# Patient Record
Sex: Female | Born: 1970
Health system: Southern US, Community
[De-identification: ages and names within clinical notes are randomized; demographics above are authoritative.]

## PROBLEM LIST (undated history)

## (undated) DIAGNOSIS — K5010 Crohn's disease of large intestine without complications: Secondary | ICD-10-CM

## (undated) DIAGNOSIS — T7840XA Allergy, unspecified, initial encounter: Secondary | ICD-10-CM

## (undated) DIAGNOSIS — F988 Other specified behavioral and emotional disorders with onset usually occurring in childhood and adolescence: Secondary | ICD-10-CM

## (undated) DIAGNOSIS — Z862 Personal history of diseases of the blood and blood-forming organs and certain disorders involving the immune mechanism: Secondary | ICD-10-CM

## (undated) DIAGNOSIS — F418 Other specified anxiety disorders: Secondary | ICD-10-CM

## (undated) DIAGNOSIS — K5090 Crohn's disease, unspecified, without complications: Secondary | ICD-10-CM

## (undated) DIAGNOSIS — K219 Gastro-esophageal reflux disease without esophagitis: Secondary | ICD-10-CM

## (undated) DIAGNOSIS — D649 Anemia, unspecified: Secondary | ICD-10-CM

## (undated) DIAGNOSIS — N200 Calculus of kidney: Secondary | ICD-10-CM

## (undated) HISTORY — DX: Personal history of diseases of the blood and blood-forming organs and certain disorders involving the immune mechanism: Z86.2

## (undated) HISTORY — PX: CHOLECYSTECTOMY: SHX55

## (undated) HISTORY — DX: Calculus of kidney: N20.0

## (undated) HISTORY — DX: Crohn's disease, unspecified, without complications: K50.90

## (undated) HISTORY — PX: TONSILLECTOMY: SUR1361

## (undated) HISTORY — DX: Allergy, unspecified, initial encounter: T78.40XA

## (undated) HISTORY — DX: Other specified anxiety disorders: F41.8

## (undated) HISTORY — DX: Crohn's disease of large intestine without complications: K50.10

## (undated) HISTORY — DX: Gastro-esophageal reflux disease without esophagitis: K21.9

## (undated) HISTORY — DX: Other specified behavioral and emotional disorders with onset usually occurring in childhood and adolescence: F98.8

---
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
## 1998-01-23 ENCOUNTER — Other Ambulatory Visit: Admission: RE | Admit: 1998-01-23 | Discharge: 1998-01-23 | Payer: Self-pay | Admitting: Obstetrics & Gynecology

## 1998-05-22 ENCOUNTER — Ambulatory Visit (HOSPITAL_COMMUNITY): Admission: RE | Admit: 1998-05-22 | Discharge: 1998-05-22 | Payer: Self-pay | Admitting: Obstetrics and Gynecology

## 1998-08-10 ENCOUNTER — Inpatient Hospital Stay (HOSPITAL_COMMUNITY): Admission: AD | Admit: 1998-08-10 | Discharge: 1998-08-14 | Payer: Self-pay | Admitting: Obstetrics & Gynecology

## 1999-07-02 ENCOUNTER — Other Ambulatory Visit: Admission: RE | Admit: 1999-07-02 | Discharge: 1999-07-02 | Payer: Self-pay | Admitting: Obstetrics & Gynecology

## 2000-09-22 ENCOUNTER — Inpatient Hospital Stay (HOSPITAL_COMMUNITY): Admission: AD | Admit: 2000-09-22 | Discharge: 2000-09-24 | Payer: Self-pay | Admitting: Obstetrics & Gynecology

## 2000-10-21 ENCOUNTER — Other Ambulatory Visit: Admission: RE | Admit: 2000-10-21 | Discharge: 2000-10-21 | Payer: Self-pay | Admitting: Obstetrics & Gynecology

## 2001-11-11 ENCOUNTER — Other Ambulatory Visit: Admission: RE | Admit: 2001-11-11 | Discharge: 2001-11-11 | Payer: Self-pay | Admitting: Obstetrics & Gynecology

## 2002-11-16 ENCOUNTER — Other Ambulatory Visit: Admission: RE | Admit: 2002-11-16 | Discharge: 2002-11-16 | Payer: Self-pay | Admitting: Obstetrics & Gynecology

## 2003-11-21 ENCOUNTER — Other Ambulatory Visit: Admission: RE | Admit: 2003-11-21 | Discharge: 2003-11-21 | Payer: Self-pay | Admitting: Obstetrics and Gynecology

## 2003-12-21 ENCOUNTER — Other Ambulatory Visit: Admission: RE | Admit: 2003-12-21 | Discharge: 2003-12-21 | Payer: Self-pay | Admitting: Obstetrics and Gynecology

## 2004-07-17 ENCOUNTER — Ambulatory Visit: Payer: Self-pay | Admitting: Gastroenterology

## 2004-08-29 ENCOUNTER — Ambulatory Visit: Payer: Self-pay | Admitting: Pulmonary Disease

## 2004-09-11 ENCOUNTER — Ambulatory Visit: Payer: Self-pay | Admitting: Gastroenterology

## 2004-09-12 ENCOUNTER — Encounter: Admission: RE | Admit: 2004-09-12 | Discharge: 2004-09-12 | Payer: Self-pay | Admitting: Gastroenterology

## 2004-11-29 ENCOUNTER — Other Ambulatory Visit: Admission: RE | Admit: 2004-11-29 | Discharge: 2004-11-29 | Payer: Self-pay | Admitting: Obstetrics and Gynecology

## 2005-05-22 ENCOUNTER — Inpatient Hospital Stay (HOSPITAL_COMMUNITY): Admission: RE | Admit: 2005-05-22 | Discharge: 2005-05-25 | Payer: Self-pay | Admitting: Obstetrics and Gynecology

## 2005-05-22 ENCOUNTER — Encounter (INDEPENDENT_AMBULATORY_CARE_PROVIDER_SITE_OTHER): Payer: Self-pay | Admitting: Specialist

## 2005-07-11 ENCOUNTER — Ambulatory Visit: Payer: Self-pay | Admitting: Gastroenterology

## 2005-07-11 ENCOUNTER — Ambulatory Visit (HOSPITAL_COMMUNITY): Admission: RE | Admit: 2005-07-11 | Discharge: 2005-07-11 | Payer: Self-pay | Admitting: Gastroenterology

## 2005-07-21 ENCOUNTER — Ambulatory Visit (HOSPITAL_COMMUNITY): Admission: RE | Admit: 2005-07-21 | Discharge: 2005-07-21 | Payer: Self-pay | Admitting: *Deleted

## 2005-07-21 ENCOUNTER — Encounter (INDEPENDENT_AMBULATORY_CARE_PROVIDER_SITE_OTHER): Payer: Self-pay | Admitting: *Deleted

## 2005-10-06 ENCOUNTER — Ambulatory Visit: Payer: Self-pay | Admitting: Pulmonary Disease

## 2006-01-14 ENCOUNTER — Ambulatory Visit: Payer: Self-pay | Admitting: Pulmonary Disease

## 2006-10-14 ENCOUNTER — Ambulatory Visit: Payer: Self-pay | Admitting: Gastroenterology

## 2006-10-14 LAB — CONVERTED CEMR LAB
Basophils Absolute: 0 10*3/uL (ref 0.0–0.1)
Basophils Relative: 0.7 % (ref 0.0–1.0)
Eosinophils Absolute: 0.2 10*3/uL (ref 0.0–0.6)
Eosinophils Relative: 2.8 % (ref 0.0–5.0)
Folate: 15.9 ng/mL
HCT: 39.2 % (ref 36.0–46.0)
Hemoglobin: 13.6 g/dL (ref 12.0–15.0)
Lymphocytes Relative: 30.4 % (ref 12.0–46.0)
MCHC: 34.6 g/dL (ref 30.0–36.0)
MCV: 94.8 fL (ref 78.0–100.0)
Monocytes Absolute: 0.3 10*3/uL (ref 0.2–0.7)
Monocytes Relative: 5.6 % (ref 3.0–11.0)
Neutro Abs: 3.7 10*3/uL (ref 1.4–7.7)
Neutrophils Relative %: 60.5 % (ref 43.0–77.0)
Platelets: 327 10*3/uL (ref 150–400)
RBC: 4.13 M/uL (ref 3.87–5.11)
RDW: 11.8 % (ref 11.5–14.6)
Sed Rate: 13 mm/hr (ref 0–25)
Vitamin B-12: 521 pg/mL (ref 211–911)
WBC: 6 10*3/uL (ref 4.5–10.5)

## 2006-12-01 ENCOUNTER — Encounter (INDEPENDENT_AMBULATORY_CARE_PROVIDER_SITE_OTHER): Payer: Self-pay | Admitting: *Deleted

## 2006-12-01 ENCOUNTER — Ambulatory Visit: Payer: Self-pay | Admitting: Gastroenterology

## 2006-12-01 ENCOUNTER — Encounter (INDEPENDENT_AMBULATORY_CARE_PROVIDER_SITE_OTHER): Payer: Self-pay | Admitting: Gastroenterology

## 2006-12-30 ENCOUNTER — Ambulatory Visit: Payer: Self-pay | Admitting: Gastroenterology

## 2008-01-21 DIAGNOSIS — K219 Gastro-esophageal reflux disease without esophagitis: Secondary | ICD-10-CM

## 2008-01-21 DIAGNOSIS — K449 Diaphragmatic hernia without obstruction or gangrene: Secondary | ICD-10-CM

## 2008-11-10 ENCOUNTER — Encounter (INDEPENDENT_AMBULATORY_CARE_PROVIDER_SITE_OTHER): Payer: Self-pay | Admitting: *Deleted

## 2009-09-08 HISTORY — PX: ABDOMINAL HYSTERECTOMY: SHX81

## 2009-10-24 ENCOUNTER — Encounter: Payer: Self-pay | Admitting: Internal Medicine

## 2009-10-24 LAB — CONVERTED CEMR LAB
TSH: 3.021 microintl units/mL
Vit D, 25-Hydroxy: 35 ng/mL
Vitamin B-12: 399 pg/mL

## 2009-12-17 ENCOUNTER — Ambulatory Visit: Payer: Self-pay | Admitting: Internal Medicine

## 2009-12-17 ENCOUNTER — Encounter (INDEPENDENT_AMBULATORY_CARE_PROVIDER_SITE_OTHER): Payer: Self-pay | Admitting: *Deleted

## 2009-12-17 DIAGNOSIS — R1319 Other dysphagia: Secondary | ICD-10-CM | POA: Insufficient documentation

## 2009-12-17 DIAGNOSIS — F341 Dysthymic disorder: Secondary | ICD-10-CM

## 2009-12-24 ENCOUNTER — Ambulatory Visit: Payer: Self-pay | Admitting: Internal Medicine

## 2009-12-24 HISTORY — PX: UPPER GASTROINTESTINAL ENDOSCOPY: SHX188

## 2009-12-24 HISTORY — PX: COLONOSCOPY W/ BIOPSIES AND POLYPECTOMY: SHX1376

## 2009-12-24 LAB — CONVERTED CEMR LAB
ALT: 13 units/L (ref 0–35)
AST: 17 units/L (ref 0–37)
Albumin: 3.7 g/dL (ref 3.5–5.2)
BUN: 9 mg/dL (ref 6–23)
Basophils Absolute: 0 10*3/uL (ref 0.0–0.1)
Basophils Relative: 0.7 % (ref 0.0–3.0)
Calcium: 9.1 mg/dL (ref 8.4–10.5)
Chloride: 108 meq/L (ref 96–112)
Eosinophils Absolute: 0.3 10*3/uL (ref 0.0–0.7)
Eosinophils Relative: 4.6 % (ref 0.0–5.0)
GFR calc non Af Amer: 73.29 mL/min (ref >60)
Glucose, Bld: 86 mg/dL (ref 70–99)
Hemoglobin: 13 g/dL (ref 12.0–15.0)
Lymphocytes Relative: 28.1 % (ref 12.0–46.0)
Lymphs Abs: 2 10*3/uL (ref 0.7–4.0)
MCHC: 34.5 g/dL (ref 30.0–36.0)
MCV: 94 fL (ref 78.0–100.0)
Monocytes Absolute: 0.4 10*3/uL (ref 0.1–1.0)
Monocytes Relative: 5.1 % (ref 3.0–12.0)
Neutro Abs: 4.3 10*3/uL (ref 1.4–7.7)
Neutrophils Relative %: 61.5 % (ref 43.0–77.0)
Platelets: 233 10*3/uL (ref 150.0–400.0)
Potassium: 3.9 meq/L (ref 3.5–5.1)
RBC: 4.01 M/uL (ref 3.87–5.11)
RDW: 13.8 % (ref 11.5–14.6)
Sodium: 143 meq/L (ref 135–145)
Total Protein: 7.2 g/dL (ref 6.0–8.3)
WBC: 7 10*3/uL (ref 4.5–10.5)

## 2009-12-28 DIAGNOSIS — K50119 Crohn's disease of large intestine with unspecified complications: Secondary | ICD-10-CM | POA: Insufficient documentation

## 2010-01-30 ENCOUNTER — Telehealth: Payer: Self-pay | Admitting: Pulmonary Disease

## 2010-02-19 ENCOUNTER — Encounter (INDEPENDENT_AMBULATORY_CARE_PROVIDER_SITE_OTHER): Payer: Self-pay | Admitting: *Deleted

## 2010-07-08 ENCOUNTER — Telehealth (INDEPENDENT_AMBULATORY_CARE_PROVIDER_SITE_OTHER): Payer: Self-pay | Admitting: *Deleted

## 2010-08-21 ENCOUNTER — Ambulatory Visit (HOSPITAL_COMMUNITY)
Admission: RE | Admit: 2010-08-21 | Discharge: 2010-08-22 | Payer: Self-pay | Source: Home / Self Care | Attending: Obstetrics and Gynecology | Admitting: Obstetrics and Gynecology

## 2010-08-21 ENCOUNTER — Encounter (INDEPENDENT_AMBULATORY_CARE_PROVIDER_SITE_OTHER): Payer: Self-pay | Admitting: Obstetrics and Gynecology

## 2010-10-10 NOTE — Letter (Signed)
Summary: Office Visit Letter  [facility]  [address] [place], [place] [address]   Phone: [phone]  Fax: [phone]      February 19, 2010 MRN: [id number]   Sydney [name] [address] [place], [place]  [address]   Dear [name],   According to our records, it is time for you to schedule a follow-up office visit with [place].   At your convenience, please call [phone] (option #2)to schedule an office visit. If you have any questions, concerns, or feel that this letter is in error, we would appreciate your call.   Sincerely,    Iva Boop, M.D.  [facility] [phone]

## 2010-10-10 NOTE — Procedures (Signed)
Summary: Colonoscopy  Patient: Sydney [name] Note: All result statuses are Final unless otherwise noted.  Tests: (1) Colonoscopy (COL)   COL Colonoscopy           DONE     [facility]     [address]     [place], [place]  [address]           COLONOSCOPY PROCEDURE REPORT           PATIENT:  Sydney [name], Sydney [name]  MR#:  [id number]     BIRTHDATE:  [birth year], 39 yrs. old  GENDER:  female     ENDOSCOPIST:  Iva Boop, MD, [facility]           PROCEDURE DATE:  12/24/2009     PROCEDURE:  Colonoscopy with biopsy     ASA CLASS:  Class I     INDICATIONS:  surveillance and high-risk screening, Crohn's     disease - Crohn's colitis for 20+ years     MEDICATIONS:   There was residual sedation effect present from     prior procedure., Fentanyl 25 mcg IV, Versed 3 mg IV           DESCRIPTION OF PROCEDURE:   After the risks benefits and     alternatives of the procedure were thoroughly explained, informed     consent was obtained.  Digital rectal exam was performed and     revealed no abnormalities.   The LB PCF-Q180AL [id number] endoscope     was introduced through the anus and advanced to the terminal ileum     which was intubated for a short distance, without limitations.     The quality of the prep was excellent, using MoviPrep.  The     instrument was then slowly withdrawn as the colon was fully     examined. Insertion: 2:34 minutes Withdrawal: 15:11 minutes     <<PROCEDUREIMAGES>>           FINDINGS:  The terminal ileum appeared normal.  Pseudopolyps     (suspected) seen  throughout the colon. Polypoid lesions     consistent with these (she has had), numerous, throughout the     colon. They were biopsied in a semi-targeted fashion along with     other mucosa in a segmental fashion. Largest size about 1 cm.     Multiple biopsies were obtained and sent to pathology.  Abnormal     appearing mucosa throughout the colon. Patchy hyperpigmentation     and somewhat scalloped mucosa (suspect  scarring). Multiple     biopsies were obtained as part of surveillance biopsy protocol and     sent to pathology.   Retroflexed views in the rectum revealed no     abnormalities.    The scope was then withdrawn from the patient     and the procedure completed.           COMPLICATIONS:  None           ENDOSCOPIC IMPRESSION:     1) Normal terminal ileum     2) Pseudopolyps (suspected) throughout the colon - biopsied     3) Abnormal mucosa throughout the colon - patchy     hyperpigmentation and scalloped mucosa without obvious     inflammation - biopsied     4) Crohn's disease - known though not much disease activity seen     today except for pseudopolyps  RECOMMENDATIONS:     1) Await biopsy results           REPEAT EXAM:  In for Colonoscopy, pending biopsy results.           Iva Boop, MD, [facility]           CC:  The Patient           n.     eSIGNED:   Iva Boop at 12/24/2009 04:07 PM           Zerita Boers, [id number]  Note: An exclamation mark (!) indicates a result that was not dispersed into the flowsheet. Document Creation Date: 12/24/2009 4:08 PM _______________________________________________________________________  (1) Order result status: Final Collection or observation date-time: 12/24/2009 15:53 Requested date-time:  Receipt date-time:  Reported date-time:  Referring Physician:   Ordering Physician: Stan Head [phone]) Specimen Source:  Source: Launa Grill Order Number: [id number] Lab site:   Appended Document: Colonoscopy     Procedures Next Due Date:    Colonoscopy: 01/2012

## 2010-10-10 NOTE — Letter (Signed)
Summary: [facility] Instructions  [facility]  [address] [place], [place] [address]   Phone: [phone]  Fax: [phone]       Verla Tailor    40 years old    MRN: [id number]      Procedure Day Dorna Bloom: Duanne Limerick, 12/24/09     Arrival Time: 2:00 PM      Procedure Time: 3:00 PM    Location of Procedure:                    _X_   Endoscopy Center (4th Floor)                       PREPARATION FOR COLONOSCOPY WITH MOVIPREP   Starting 5 days prior to your procedure 12/19/09 do not eat nuts, seeds, popcorn, corn, beans, peas,  salads, or any raw vegetables.  Do not take any fiber supplements (e.g. Metamucil, Citrucel, and Benefiber).  THE DAY BEFORE YOUR PROCEDURE         SUNDAY, 12/23/09  1.  Drink clear liquids the entire day-NO SOLID FOOD  2.  Do not drink anything colored red or purple.  Avoid juices with pulp.  No orange juice.  3.  Drink at least 64 oz. (8 glasses) of fluid/clear liquids during the day to prevent dehydration and help the prep work efficiently.  CLEAR LIQUIDS INCLUDE: Water Jello Ice Popsicles Tea (sugar ok, no milk/cream) Powdered fruit flavored drinks Coffee (sugar ok, no milk/cream) Gatorade Juice: apple, white grape, white cranberry  Lemonade Clear bullion, consomm, broth Carbonated beverages (any kind) Strained chicken noodle soup Hard Candy                             4.  In the morning, mix first dose of MoviPrep solution:    Empty 1 Pouch A and 1 Pouch B into the disposable container    Add lukewarm drinking water to the top line of the container. Mix to dissolve    Refrigerate (mixed solution should be used within 24 hrs)  5.  Begin drinking the prep at 5:00 p.m. The MoviPrep container is divided by 4 marks.   Every 15 minutes drink the solution down to the next mark (approximately 8 oz) until the full liter is complete.   6.  Follow completed prep with 16 oz of clear liquid of your choice (Nothing red or purple).   Continue to drink clear liquids until bedtime.   7.  Before going to bed, mix second dose of MoviPrep solution:    Empty 1 Pouch A and 1 Pouch B into the disposable container    Add lukewarm drinking water to the top line of the container. Mix to dissolve    Refrigerate  THE DAY OF YOUR PROCEDURE      MONDAY, 12/24/09  Beginning at 10:00 a.m. (5 hours before procedure):         1. Every 15 minutes, drink the solution down to the next mark (approx 8 oz) until the full liter is complete.  2. Follow completed prep with 16 oz. of clear liquid of your choice.    3. You may drink clear liquids until 1:00 PM (2 HOURS BEFORE PROCEDURE).  MEDICATION INSTRUCTIONS  Unless otherwise instructed, you should take regular prescription medications with a small sip of water   as early as possible the morning of your procedure.       OTHER  INSTRUCTIONS  You will need a responsible adult at least 42 years of age to accompany you and drive you home.   This person must remain in the waiting room during your procedure.  Wear loose fitting clothing that is easily removed.  Leave jewelry and other valuables at home.  However, you may wish to bring a book to read or  an iPod/MP3 player to listen to music as you wait for your procedure to start.  Remove all body piercing jewelry and leave at home.  Total time from sign-in until discharge is approximately 2-3 hours.  You should go home directly after your procedure and rest.  You can resume normal activities the  day after your procedure.  The day of your procedure you should not:   Drive   Make legal decisions   Operate machinery   Drink alcohol   Return to work  You will receive specific instructions about eating, activities and medications before you leave.  The above instructions have been reviewed and explained to me by   [facility], CMA   I fully understand and can verbalize these instructions _____________________________ Date  _________

## 2010-10-10 NOTE — Assessment & Plan Note (Signed)
Summary: Crohn's, dysphagia    History of Present Illness Visit Type: Follow-up Visit Primary GI MD: Stan Head MD [facility] Primary Provider: Alroy Dust, MD  Requesting Provider: n/a Chief Complaint: Heartburn  History of Present Illness:   1) Crohn's colitis - recall letter 11/2008 - last colonoscopy 3/08 She has been off therapy x 4 years - got tired of taking so many pills  "Dr. Doreatha Martin spoiled me letting me do what I wanted to"  She is having problems in the mornings and then I get settled down. Diarrhea in the morning, "pretty bad" or "after I eat a meal, especially out". Not nocturnal except rare. Abdominal pain occurs in bilateral lower quadrants and flanks. No rectal bleeding.  Not much appetitie, changed from Wellbutrin and Zoloft for depression and anxiety, now on Prozac x 1 monh.  2) Dysphagia and heartburn She is most concerned about dysphagia and terrible indigestion - last 1-2 weeks especially. Better now. It has been there for 1 month.  She was eating "right many Tums". She was also on Vyvanse but did ot tolerate due to insomnia. "Tea and coffee frequently"   GI Review of Systems    Reports heartburn.      Denies abdominal pain, acid reflux, belching, bloating, chest pain, dysphagia with liquids, dysphagia with solids, loss of appetite, nausea, vomiting, vomiting blood, weight loss, and  weight gain.        Denies anal fissure, black tarry stools, change in bowel habit, constipation, diarrhea, diverticulosis, fecal incontinence, heme positive stool, hemorrhoids, irritable bowel syndrome, jaundice, light color stool, liver problems, rectal bleeding, and  rectal pain.    Current Medications (verified): 1)  Tums 500 Mg Chew (Calcium Carbonate Antacid) .... As Needed 2)  Prozac 40 Mg Caps (Fluoxetine Hcl) .... One Tablet By Mouth Two Times A Day  Allergies (verified): 1)  Pcn 2)  Flagyl  Past History:  Past Medical History: GERD (ICD-530.81) CROHN'S DISEASE  (ICD-555.9) ADD Depression/anxiety  Past Surgical History: Cholecystectomy C-Section x 3  Family History: No FH of Colon Cancer:  Social History: Occupation: Unemployed  Married , 3 kids Patient is a former smoker: Research scientist (life sciences) Alcohol Use - no Daily Caffeine Use: Tea and Coffee all day Illicit Drug Use - no Smoking Status:  quit Drug Use:  no  Review of Systems       anxiety and depression under changing therapy (Dr. Evelene Croon) knee, wrist and ankle pains All other ROS negative except as per HPI.   Vital Signs:  Patient profile:   40 year old female Height:      63 inches Weight:      140 pounds BMI:     24.89 BSA:     1.66 Pulse rate:   76 / minute Pulse rhythm:   regular BP sitting:   112 / 68  (left arm) Cuff size:   regular  Vitals Entered By: Ok Anis CMA (December 17, 2009 8:56 AM)  Physical Exam  General:  Well developed, well nourished, no acute distress. Head:  Normocephalic and atraumatic. Eyes:  PERRLA, no icterus. Mouth:  No deformity or lesions, dentition normal. Neck:  Supple; no masses or thyromegaly. Lungs:  Clear throughout to auscultation. Heart:  Regular rate and rhythm; no murmurs, rubs,  or bruits. Abdomen:  Soft, nontender and nondistended. No masses, hepatosplenomegaly or hernias noted. Normal bowel sounds. Rectal:  deferred until time of colonoscopy.   Extremities:  No clubbing, cyanosis, edema or deformities noted. Neurologic:  Alert and  oriented  x4;  Skin:  tattoos lower extremities Cervical Nodes:  No significant cervical or supraclavicular adenopathy.   Psych:  mildly anxious   Impression & Recommendations:  Problem # 1:  CROHN'S DISEASE, LARGE INTESTINE (ICD-555.1) Assessment New dDiagnosed 1991, ASA and prednisone originally, 1994-96. Then ASA tx +/- prednisone. needs screening and surveillance - diagnosed in her teens, has been on prednisone and ASA therapy but non-compliant with therapy over time.  restart meds ater ROI  [place] labs - have returned and flowed, no CBC, B12 was ok 12/30/06  Problem # 2:  DYSPHAGIA (ICD-787.29) Assessment: New with heartburn, known GERD so that or possible stricture (or both) is most likely problem better than it was Dexilant once daily samples then EGD, possible dilation - reassess at time of EGD re: dilation  Orders: Colon/Endo (Colon/Endo)  Problem # 3:  SCREENING, COLON CANCER (ICD-V76.51) Assessment: Unchanged Higher0-risk due to Crohn;s colitis Orders: Colon/Endo (Colon/Endo)  Problem # 4:  ANXIETY DEPRESSION (ICD-300.4) Assessment: Unchanged  Patient Instructions: 1)  Please pick up your medications at your pharmacy. MOVIPREP 2)  We will see you at your procedure on 12/24/09. 3)  Dexilant samples given.  Take as directed in list below.  Call our office with a symptom update and we will call in a prescription for you. 4)  [facility] Patient Information Guide given to patient.  5)  Colonoscopy and Flexible Sigmoidoscopy brochure given.  6)  Upper Endoscopy with Dilatation brochure given.  7)  Copy sent to : Alroy Dust, MD 8)  The medication list was reviewed and reconciled.  All changed / newly prescribed medications were explained.  A complete medication list was provided to the patient / caregiver. Prescriptions: MOVIPREP 100 GM  SOLR (PEG-KCL-NACL-NASULF-NA ASC-C) As per prep instructions.  #1 x 0   Entered by:   Francee Piccolo CMA (AAMA)   Authorized by:   Iva Boop MD, [facility]   Signed by:   Francee Piccolo CMA (AAMA) on 12/17/2009   Method used:   Electronically to        [facility] [phone]* (retail)       [address]       [place], [place]  [address]       Ph: [phone] or [phone]       Fax: [phone]   RxID:   [id number]   Appended Document: Crohn's, dysphagia she needs CBC, CMET and CRP re: Crohn's dx  Appended Document: Crohn's, dysphagia Left message for patient to call  back to discuss getting lab work on Monday prior to her ECL    Appended Document: Crohn's, dysphagia Patient aware to come for labs pior to ECl on Monday  Appended Document: Crohn's, dysphagia   Past History:  Past Medical History:    GERD (ICD-530.81)    CROHN'S COLITIS  dx 12/91, signs onset same    CROHN'S DUODENITIS AND ESOPHAGITIS, ? HSV ESOPHAGITIS    Nephrolithiasis    ADD    Depression/anxiety    left ankle fx 1990      Family History: Family History of Colitis/Crohn's:mother, grandmother, ? great-grandmother Family History of Colon Cancer: great-grandmother

## 2010-10-10 NOTE — Progress Notes (Signed)
Summary: headaches/ fatigue > needs to est w/ another PCP  Phone Note Call from Patient   Caller: Patient Call For: nadel Summary of Call: pt c/o headaches and fatigue x 2 wks. request to see dr Kriste Basque. pt last saw dr Kriste Basque in 2007. call [phone] or home # above.  Initial call taken by: Tivis Ringer, CNA,  July 08, 2010 1:01 PM  Follow-up for Phone Call        pt last saw SN 01/2006.  paper chart ordered for SN to review.  will forward message to leigh. Boone Master CNA/MA  July 08, 2010 3:02 PM   Additional Follow-up for Phone Call Additional follow up Details #1::        per SN---   SN is booked solid---rec refer to primary care downstairs   or if she has been seeing primary care doc Randell Loop [facility]  July 08, 2010 4:32 PM   called spoke with patient, advised of SN's recs as stated above.  pt verbalized her understanding.  gave # for [facility] downstairs. Additional Follow-up by: Boone Master CNA/MA,  July 08, 2010 4:39 PM

## 2010-10-10 NOTE — Progress Notes (Signed)
Summary: want to ber reinstated as pt  Phone Note Call from Patient   Caller: Patient Call For: [name] Summary of Call: pt last seen in 5/07 would like to see dr Kriste Basque for poison ivey and allergy problem Initial call taken by: Rickard Patience,  Jan 30, 2010 9:06 AM  Follow-up for Phone Call        pls ask sn if he wants to see pt back and if so need appt time   Temisha Murley [facility]  Jan 30, 2010 9:42 AM   Additional Follow-up for Phone Call Additional follow up Details #1::        per SN---ok for follow up appt with SN or TP.  thanks Randell Loop CMA  Jan 30, 2010 10:54 AM   LMTCB. Carron Curie CMA  Jan 30, 2010 11:09 AM  pt schedueld to see TP on 01-31-10 at 10:30. Carron Curie CMA  Jan 30, 2010 12:20 PM

## 2010-10-10 NOTE — Procedures (Signed)
Summary: Upper Endoscopy  Patient: Sydney [name] Note: All result statuses are Final unless otherwise noted.  Tests: (1) Upper Endoscopy (EGD)   EGD Upper Endoscopy       [facility] [address]     [place], [place]  [id number]           ENDOSCOPY PROCEDURE REPORT           PATIENT:  [name], [name]  MR#:  [id number]     BIRTHDATE:  08-Dec-1970, 39 yrs. old  GENDER:  female           ENDOSCOPIST:  Gatha Mayer, MD, [facility]           PROCEDURE DATE:  12/24/2009     PROCEDURE:  EGD with biopsy     ASA CLASS:  Class I     INDICATIONS:  heartburn, dysphagia           MEDICATIONS:   Fentanyl 75 mcg IV, Benadryl 25 mg IV, Versed 7 mg     IV     TOPICAL ANESTHETIC:  Exactacain Spray           DESCRIPTION OF PROCEDURE:   After the risks benefits and     alternatives of the procedure were thoroughly explained, informed     consent was obtained.  The LB [id number] [id number] endoscope was     introduced through the mouth and advanced to the second portion of     the duodenum, without limitations.  The instrument was slowly     withdrawn as the mucosa was fully examined.     <<PROCEDUREIMAGES>>           Mild gastritis was found. Patchy erythema and friable mucosa,     especially in proximal antrum. Multiple biopsies were obtained and     sent to pathology.  Otherwise the examination was normal.     Retroflexed views revealed no abnormalities.    The scope was then     withdrawn from the patient and the procedure completed.           COMPLICATIONS:  None           ENDOSCOPIC IMPRESSION:     1) Mild gastritis - biopsied     2) Otherwise normal examination     RECOMMENDATIONS:     1) continue current medications     2) Await biopsy results     3) colonoscopy next           REPEAT EXAM:  as needed           Gatha Mayer, MD, [facility]           CC:  The Patient           n.     eSIGNED:   Gatha Mayer at 12/24/2009 03:56 PM           Dillon Bjork,  [id number]  Note: An exclamation mark (!) indicates a result that was not dispersed into the flowsheet. Document Creation Date: 12/24/2009 3:57 PM _______________________________________________________________________  (1) Order result status: Final Collection or observation date-time: 12/24/2009 15:29 Requested date-time:  Receipt date-time:  Reported date-time:  Referring Physician:   Ordering Physician: Silvano Rusk [phone]) Specimen Source:  Source: Tawanna Cooler Order Number: [id number] Lab site:

## 2010-11-18 LAB — COMPREHENSIVE METABOLIC PANEL
ALT: 14 U/L (ref 0–35)
AST: 20 U/L (ref 0–37)
Albumin: 2.8 g/dL — ABNORMAL LOW (ref 3.5–5.2)
Alkaline Phosphatase: 50 U/L (ref 39–117)
BUN: 4 mg/dL — ABNORMAL LOW (ref 6–23)
CO2: 29 mEq/L (ref 19–32)
Calcium: 8.2 mg/dL — ABNORMAL LOW (ref 8.4–10.5)
Chloride: 98 mEq/L (ref 96–112)
Creatinine, Ser: 0.82 mg/dL (ref 0.4–1.2)
GFR calc Af Amer: >60 mL/min (ref >60)
GFR calc non Af Amer: >60 mL/min (ref >60)
Glucose, Bld: 122 mg/dL — ABNORMAL HIGH (ref 70–99)
Potassium: 3.7 mEq/L (ref 3.5–5.1)
Sodium: 132 mEq/L — ABNORMAL LOW (ref 135–145)
Total Bilirubin: 1.5 mg/dL — ABNORMAL HIGH (ref 0.3–1.2)
Total Protein: 5 g/dL — ABNORMAL LOW (ref 6.0–8.3)

## 2010-11-18 LAB — CBC
HCT: 31.6 % — ABNORMAL LOW (ref 36.0–46.0)
HCT: 37.6 % (ref 36.0–46.0)
Hemoglobin: 10.2 g/dL — ABNORMAL LOW (ref 12.0–15.0)
Hemoglobin: 12.8 g/dL (ref 12.0–15.0)
MCH: 31.3 pg (ref 26.0–34.0)
MCH: 33.1 pg (ref 26.0–34.0)
MCHC: 32.3 g/dL (ref 30.0–36.0)
MCHC: 34 g/dL (ref 30.0–36.0)
MCV: 96.9 fL (ref 78.0–100.0)
MCV: 97.4 fL (ref 78.0–100.0)
Platelets: 174 10*3/uL (ref 150–400)
Platelets: 272 10*3/uL (ref 150–400)
RBC: 3.26 MIL/uL — ABNORMAL LOW (ref 3.87–5.11)
RBC: 3.86 MIL/uL — ABNORMAL LOW (ref 3.87–5.11)
RDW: 12.5 % (ref 11.5–15.5)
RDW: 13.4 % (ref 11.5–15.5)
WBC: 10.8 10*3/uL — ABNORMAL HIGH (ref 4.0–10.5)
WBC: 8.1 10*3/uL (ref 4.0–10.5)

## 2010-11-18 LAB — SURGICAL PCR SCREEN
MRSA, PCR: NEGATIVE
Staphylococcus aureus: NEGATIVE

## 2011-01-24 NOTE — Discharge Summary (Signed)
[facility]  Patient:    Sydney [name], Sydney [name]                         MRN: [id number] Adm. Date:  [id number] Disc. Date: [id number] Attending:  Huntley Dec Dictator:   Jeannette How, P.A.                           Discharge Summary  FINAL DIAGNOSES:              1. Intrauterine pregnancy at [redacted] weeks                                  gestation.                               2. Repeat cesarean section.  PROCEDURE:                    Repeat low transverse cesarean section.  SURGEON:                      Cristopher Estimable. Stann Mainland, M.D.  ASSISTANT:                    Barbaraann Rondo, M.D.  COMPLICATIONS:                None.  HOSPITAL COURSE:              This 40 year old, G3, P1-0-1-1, presents at [redacted] weeks gestation for repeat cesarean section. The patient had a history of a cesarean section in 1999 and refuses VBAC at this time. The patient has a history of infertility. She got pregnant on Clomid with this current pregnancy. She also has Crohns disease which was controlled by Dr. Rachelle Hora throughout her pregnancy. The patient was on prednisone at this point. She also had positive group B strep culture. She was taken to the operating room by on September 22, 2000 by Dr. Vania Rea where a repeat low transverse cesarean section was performed with the delivery of a 7-pound 14-ounce female infant with Apgars of 9 and 9. Delivery went without complications.  The patients postoperative course was benign without significant fevers. The patient was felt ready for discharge on postoperative day #2.  DISCHARGE INSTRUCTIONS:       She was sent home on a regular diet and told to decrease activities.  DISCHARGE MEDICATIONS:        1. Continue prenatal vitamins and FeSO4.                               2. Motrin 600 mg one every six hours as needed                                  for pain.                               3. Tylox one to two every four hours as  needed  for pain.  FOLLOWUP:                     Follow up in the office in four weeks. DD:  10/16/00 TD:  10/17/00 Job: [id number]

## 2011-01-24 NOTE — Assessment & Plan Note (Signed)
[facility] OFFICE NOTE   NAME:[name], Sydney GRADE                         MRN:          [id number]  DATE:10/14/2006                            DOB:          05/16/1971    [name] says she is doing great.  She looks fabulous.  She has got three  children now and they are just beautiful, she showed pictures of them.  Her medications now includes only some prenatal vitamins.  She stopped  all her medicine about 9 months ago, says she has been doing well.  Looking through her chart, however, reveals she had her last  colonoscopic examination in 2003, and she really needs another one.  She  had pan colitis and it really looked rather bad at that time, and she  has had none since.  [name] has known diffuse Crohn's disease starting in  her esophagus involving her whole GI tract in the past, and has required  a great deal of therapy in the past, and it is interesting she says she  has done well without any medication recently.   PHYSICAL EXAMINATION:  She looks great.  She weighs 158, blood pressure  120/70, pulse 84 and regular.  Neck arteries and extremities are all unremarkable.   IMPRESSION:  1. Crohn's colitis, in remission.  2. History of gastroesophageal reflux disease.  3. History of anxiety-depression, doing well at this time.   RECOMMENDATIONS:  Get routine labs on her and schedule her for a  colonoscopic examination, and then we will determine what her  therapeutic needs will be after I take a look at this colon.  Certainly  needs screen for cancer after all this as well.     Clarene Reamer, MD  Electronically Signed    SML/MedQ  DD: 10/14/2006  DT: 10/15/2006  Job #: [id number]

## 2011-01-24 NOTE — Discharge Summary (Signed)
Sydney [name], [name] NO.:  [id number]   MEDICAL RECORD NO.:  [id number]          PATIENT TYPE:  INP   LOCATION:  9101                          FACILITY:  [facility]   PHYSICIAN:  Gus Height, M.D.       DATE OF BIRTH:  Oct 19, 1970   DATE OF ADMISSION:  05/22/2005  DATE OF DISCHARGE:  05/25/2005                                 DISCHARGE SUMMARY   FINAL DIAGNOSIS:  Intrauterine pregnancy at term, history of previous  cesarean section, desires repeat cesarean section, and desires permanent  sterilization, advanced maternal age and Crohn's disease.   PROCEDURE:  Repeat low transverse cesarean section with bilateral tubal  ligation. Surgeon Dr. Philis Pique and assistant Dr. Gus Height.   COMPLICATIONS:  None.   HISTORY OF PRESENT ILLNESS:  This is 40 year old gravida 4, para 2, presents  at term for repeat cesarean section. The patient had a previous cesarean  section and desires repeat with this pregnancy. The patient's antepartum  course was complicated by advanced maternal age. She declined amniocentesis  but did have a normal quad screen around 15 weeks. The patient also has  Crohn's disease which she was on and off prednisone for throughout her  pregnancy. She also had positive group B strep culture obtained in the  office at 36 weeks.   HOSPITAL COURSE:  The patient was taken to the operating room on May 22, 2005 by Dr. Bobbye Charleston where a repeat low transverse cesarean  section was performed with delivery of a 7 pounds 10 ounces female infant  with Apgars of 8 and 9. Delivery without complications. At this point a  bilateral tubal ligation was performed without complications. The patient's  postoperative course was benign without any significant fevers. She was felt  ready for discharge on postoperative day #3, was sent home on a regular  diet, told to decrease activities, told to continue her prenatal vitamins  and iron supplement daily, was given Tylox one  to two every 4 hours as  needed for pain, told she could use over-the-counter Motrin up to 600  milligrams every 6-8 hours as needed for pain, was to follow up in the  office in 4 weeks.   LABS ON DISCHARGE:  The patient had a hemoglobin of 9.6, white blood cell  count of 8.8.      Sydney [name], P.A.-C.      Gus Height, M.D.  Electronically Signed    MB/MEDQ  D:  07/02/2005  T:  07/02/2005  Job:  [id number]

## 2011-01-24 NOTE — Op Note (Signed)
Sydney [name], Sydney [name]                  ACCOUNT NO.:  [id number]   MEDICAL RECORD NO.:  [id number]          PATIENT TYPE:  AMB   LOCATION:  DAY                          FACILITY:  [facility]   PHYSICIAN:  Darrelyn Hillock, MDDATE OF BIRTH:  [birth year]   DATE OF PROCEDURE:  07/21/2005  DATE OF DISCHARGE:                                 OPERATIVE REPORT   PREOPERATIVE DIAGNOSIS:  Cholelithiasis.   POSTOPERATIVE DIAGNOSIS:  Cholelithiasis.   PROCEDURE:  Laparoscopic cholecystectomy with intraoperative cholangiogram.   SURGEON:  Darrelyn Hillock, MD   ASSISTANT:  Isabel Caprice. Hassell Done, MD   ANESTHESIA:  General.   DESCRIPTION:  The patient was taken to the operating room and placed in a  supine position.  After adequate general anesthesia was induced using  endotracheal tube, the abdomen was prepped and draped in a normal sterile  fashion.  Using a transverse infraumbilical incision, I dissected down to  the fascia.  The fascia was opened vertically.  An 0 Vicryl purse-string  suture was placed around the fascial defect, and a blunt trocar was placed  in the abdomen.  Pneumoperitoneum was obtained.  Under direct vision, a 10  mm trocar was placed in the subxiphoid region.  Two 5 mm ports were placed  in the right abdomen.  Gallbladder was identified and retracted cephalad.  Dissection at the neck of the gallbladder was undertaken.  The cystic duct  was identified in a critical view, and after dissecting, the triangle of  Calot was identified.  The cystic duct was clipped at the gallbladder, and a  small ductotomy was made.  Cholangiogram was performed, which showed free  flow into the duodenum, a rather narrow distal common duct but normal common  bile duct diameter.  No evidence of filling defects.  There was normal  filling of the right and left hepatic ducts.  The cholangiocatheter was  removed, and the cystic duct was triply clipped and divided.  The cystic  artery was identified  in a similar fashion, triply clipped and divided.  It  was taken off the gallbladder bed using Bovie electrocautery and removed  through the umbilical port.  There was some bleeding from the hepatic bed,  which was controlled with electrocautery and with Surgicel.  The right upper  quadrant was copiously irrigated.  The infraumbilical fascial defect was  closed with the 0 Vicryl purse-string suture.  The skin incisions were  closed with subcuticular 4-0 Monocryl.  Steri-Strips and sterile dressings  were applied.  The patient tolerated the procedure well and went to the PACU  in good condition.      Darrelyn Hillock, MD  Electronically Signed     KRH/MEDQ  D:  07/21/2005  T:  07/21/2005  Job:  [id number]

## 2011-01-24 NOTE — Op Note (Signed)
[facility]  Patient:    Sydney [name], Sydney [name]                         MRN: [id number] Proc. Date: 09/22/00 Adm. Date:  [id number] Attending:  Huntley Dec                           Operative Report  PREOPERATIVE DIAGNOSIS:       Repeat cesarean section at 39 weeks.  POSTOPERATIVE DIAGNOSES:      1. Repeat cesarean section at 39 weeks.                               2. Delivery of 7 lb 14 oz female infant with                                  Apgars of 9 and 9.  PROCEDURE:                    Repeat low transverse cesarean section.  SURGEON:                      Cristopher Estimable. Stann Mainland, M.D.  ASSISTANT:                    Barbaraann Rondo, M.D.  ANESTHESIA:                   Spinal.  ESTIMATED BLOOD LOSS:         750 cc.  DESCRIPTION OF PROCEDURE:     The patient was taken to the operating room where, after satisfactory induction of spinal anesthetic by Dr. Ambrose Pancoast, The patient was prepped and draped and a Foley catheter placed.  After good documentation of anesthetic levels, the procedure was begun.  A Pfannenstiel incision was made and dissected down to the fascia, which was likewise incised in a low transverse fashion.  Hemostasis was created at each layer.  A subfascial space was created inferiorly and superiorly.  The muscles were separated in the midline and the peritoneum identified and entered appropriately, with care taken to avoid the bowel superiorly and the bladder inferiorly.  At this time, the vesicouterine peritoneum was incised in a low transverse fashion and pushed off the lower uterine segment with ease.  A sharp incision with the use of Metzenbaum scissors was carried out.  The lower edge of the anterior placenta was encountered.  The incision was extended lateral with the fingers.  Amniotomy was created with production of clear fluid and, thereafter, delivery of a viable female infant was accomplished from the vertex position.  The infant  cried spontaneously at once.  After the cord was clamped and cut after delivery, the infant was passed off to the awaiting team.  Subsequent weight was found to be 7 lb 14 oz and Apgars of 9 and 9. Cord bloods were collected.  Thereafter, the placenta was delivered intact. The uterus was then exteriorized and good contractility afforded with slowly given intravenous Pitocin and manual stimulation.  The cavity was inspected and rendered free of any remaining products of conception.  There was a fair amount of bleeding noted from the right angle.  This was clamped with ring forceps.  The uterine incision was  closed at this time using #1 Vicryl in a running locking fashion.  Several figure-of-eight #1 Vicryls were then applied to the right angle, eventually rendering the right angle hemostatic.  The bladder was noted to be down well during the repair of the right angle.  A hematoma was evacuated in the process of closing the right angle completely. At this time, the incision was noted to be dry.  The tubes and ovaries were inspected and noted to be normal.  The uterus was thereafter replaced in the abdominal cavity after the abdomen was lavaged of all blood and clot.  At this time, all foreign bodies were noted to be removed from the abdominal cavity. All counts were correct.  Closure of the abdomen at this time was carried out in layers.  The abdominal peritoneum was closed with 0 Vicryl in a running fashion.  The muscles were secured with the same.  Assured of good subfascial hemostasis, the fascia was then reapproximated with 0 Vicryl from angle to midline bilaterally.  The subcu tissue was then rendered hemostatic and staples were then employed to close the skin.  A sterile pressure dressing was applied.  The patient at this time was transported to the recovery room in satisfactory condition, having tolerated the procedure well.  Estimated blood loss was 150 cc.  All counts were correct x  2.  At the conclusion of the procedure, both the mother and the baby were doing well in their respective recovery areas. DD:  09/22/00 TD:  09/22/00 Job: [id number]

## 2011-01-24 NOTE — Op Note (Signed)
NAMEADILENY, Sydney NO.:  [id number]   MEDICAL RECORD NO.:  [id number]          PATIENT TYPE:  INP   LOCATION:  9101                          FACILITY:  [facility]   PHYSICIAN:  Bobbye Charleston, M.D. DATE OF BIRTH:  10/09/1970   DATE OF PROCEDURE:  05/22/2005  DATE OF DISCHARGE:                                 OPERATIVE REPORT   PREOPERATIVE DIAGNOSIS:  Previous cesarean section, term pregnancy and  multiparous, desires permanent sterility.   POSTOPERATIVE DIAGNOSIS:  Previous cesarean section, term pregnancy and  multiparous, desires permanent sterility.   PROCEDURE:  Repeat low-transverse cesarean section with bilateral tubal  ligation.   SURGEON:  Bobbye Charleston, M.D.   ASSISTANT:  Gus Height, M.D.   ANESTHESIA:  Spinal.   SPECIMENS:  A right and left tubal segments.   ESTIMATED BLOOD LOSS:  800 mL.   IV FLUIDS:   URINE OUTPUT:   COMPLICATIONS:  None.   FINDINGS:  Right tube was slightly stuck to the right ovary although the  fimbria were clear and we were able to get a good segment at the isthmic  portion of the tube. Baby was vertex presentation, Apgars 08/09, weight 7  pounds 10 ounces. Baby was female.   MEDICATION:  Pitocin and Ancef.   COUNTS:  Correct x3.   DESCRIPTION OF PROCEDURE:  After adequate spinal anesthesia was achieved,  the patient was prepped and draped in sterile fashion in dorsal supine  position with leftward tilt. The Pfannenstiel skin incision was made with  scalpel, carried down to fascia with Bovie cautery. The fascia incised in  midline with scalpel and carried in a transverse curvilinear manner with  Mayo scissors. The fascia was reflected from the rectus muscles both  superiorly and inferiorly. Rectus muscles split in midline. Bowel free  portion peritoneum was entered into carefully with sharp dissection and then  incised in superior and inferior manner with the Metzenbaum scissors with  good visualization of  bowel and bladder.   The bladder blade was placed. The vesicouterine fascia was tented up and  incised in transverse curvilinear manner. The bladder flap was retracted out  of the field dissection and 2 cm incision was made transversely in the  uterus in the upper portion of lower uterine segment. Clear fluid was noted  on entry to the amnion and incision was extended with the bandage scissors  in a transverse curvilinear manner. A vacuum extraction was used to deliver  the baby's head and rest of the baby was delivered without complication. The  baby was bulb suctioned on the abdomen and cord was clamped, cut and the  baby was handed to awaiting pediatricians.  Cord bloods were obtained and  then the placenta was removed manually. The uterus was exteriorized, wrapped  in wet lap, cleared of all debris. The uterine incision was closed with  running lock stitch of 0 Monocryl. Imbricating layer of 0 Monocryl was  performed as well. Attention was then turned to the tubes which were both  tented up with Babcock. An avascular portion of the mesosalpinx was entered  into with Bovie cautery and two freehand ties of 0-0 plain gut were passed.  Each tie was tied down to create intervening segment approximately 3 cm.  Each segment was excised with a Metzenbaum scissors and there was good  hemostasis. The uterus was then reapproximated in the abdominal cavity and  the tubal site on the left-hand side reinspected and found to have small  amount of oozing on the edge of the peritoneum. This was tented up with  Claiborne Billings and then a freehand tie of 0-0 plain gut was placed. The right hand  tube site was hemostatic. The uterine incision was reinspected and a small  amount of bleeding was noted from the middle of the incision which was taken  care of with the figure-of-eight stitch. The rest of the incision was  hemostatic after this. The gutters were cleared of all debris with  irrigation and all instruments  withdrawn from the abdomen. The peritoneum  was closed with running stitch of 2-0 Vicryl. The fascia was closed with  running stitch of 0 Vicryl. Subcutaneous tissue was rendered hemostatic with  Bovie cautery and irrigation. The skin was closed with staples.      Bobbye Charleston, M.D.  Electronically Signed     MH/MEDQ  D:  05/22/2005  T:  05/22/2005  Job:  [id number]

## 2011-01-24 NOTE — Assessment & Plan Note (Signed)
[facility] OFFICE NOTE   NAME:[name], Sydney ROBISON                         MRN:          [id number]  DATE:12/30/2006                            DOB:          19-Jan-1971    This wonderful patient of mine comes in to discuss her colonoscopy  findings. She has Crohn's colitis in remission. Her biopsies really did  not show anything of significance. I sent her out the results. It showed  changes consistent with multiple hyperplastic polyps, predominant  lymphoid aggregates and this was the same throughout her colon. She had  multiple pseudo polyps throughout her colon, but they did not describe  any real inflammatory changes at all. My interpretation was a pan  colitis, diffuse old Crohn's changes with multiple pseudo polyps. It is  interesting that she has been off of medication and everything seems to  be doing well. As far as she is concerned, she would like to go on  Entocort E.C., which her mother takes. It is interesting that her  mother, grandmother all had Crohn's disease and she said her great-  grandmother died from colon cancer and could have had Crohn's disease  and just did not know what it was. I told her I would start her back on  Entocort E.C. She said she thought it would make her feel better. She  does get some arthritic symptoms and so on. She has been on steroids  before and Pentasa. She would like to stop the Pentasa and go on  Entocort, which I wrote a prescription for and gave her some samples. I  told that she was going to be followed up by Dr. Carlean Purl in the future  and that I would have him decide the future of her care and whether to  continue on the Entocort, methylamine. Get a capsule endoscopy of her  small bowel and so on.. In any case, I think she is doing as well as I  have seen her in a long time, even off of medication.   [name] is a fantastic young lady who always has a great smile and  hopefully she will do well in the future. I did indicate to her that she  should not feel uncomfortable if her new doctors have different  philosophies about their treatment and no doctors always have the same  therapeutic regimen depending upon experience and so on. She seems fine  with this.     Clarene Reamer, MD  Electronically Signed    SML/MedQ  DD: 12/30/2006  DT: 12/30/2006  Job #: [id number]   cc:   Gatha Mayer, MD,FACG

## 2011-06-17 ENCOUNTER — Telehealth: Payer: Self-pay | Admitting: Gastroenterology

## 2011-06-17 NOTE — Telephone Encounter (Signed)
Left a message on voice mail for patient to return my call. 

## 2011-09-29 NOTE — Telephone Encounter (Signed)
Patient has an upcoming appointment 10/17/11.

## 2011-10-17 ENCOUNTER — Ambulatory Visit: Payer: Self-pay | Admitting: Internal Medicine

## 2011-10-20 ENCOUNTER — Telehealth: Payer: Self-pay | Admitting: Gastroenterology

## 2011-10-20 NOTE — Telephone Encounter (Signed)
Appointment scheduled.

## 2011-10-20 NOTE — Telephone Encounter (Signed)
Called the patient and left a message on voice mail for her to return my call.

## 2011-10-20 NOTE — Telephone Encounter (Signed)
Message copied by Bernita Buffy on Mon Oct 20, 2011 11:23 AM ------      Message from: Tedra Senegal A      Created: Mon Sep 29, 2011  9:15 AM       Recall Project      Patient has an follow-up office visit 10/17/11. Ask patient about recall Colonoscopy. If patient doesn't shoe up for appointment. Call patient about recall colon.

## 2011-11-12 ENCOUNTER — Ambulatory Visit: Payer: Self-pay | Admitting: Internal Medicine

## 2011-11-19 ENCOUNTER — Encounter: Payer: Self-pay | Admitting: Internal Medicine

## 2011-11-28 ENCOUNTER — Other Ambulatory Visit: Payer: Self-pay | Admitting: Obstetrics and Gynecology

## 2012-10-14 ENCOUNTER — Encounter: Payer: Self-pay | Admitting: Internal Medicine

## 2015-02-13 ENCOUNTER — Other Ambulatory Visit: Payer: Self-pay | Admitting: Obstetrics and Gynecology

## 2015-02-15 LAB — CYTOLOGY - PAP

## 2016-03-24 ENCOUNTER — Encounter (HOSPITAL_COMMUNITY): Payer: Self-pay | Admitting: Emergency Medicine

## 2016-03-24 ENCOUNTER — Ambulatory Visit (INDEPENDENT_AMBULATORY_CARE_PROVIDER_SITE_OTHER): Payer: Managed Care, Other (non HMO)

## 2016-03-24 ENCOUNTER — Ambulatory Visit (HOSPITAL_COMMUNITY)
Admission: EM | Admit: 2016-03-24 | Discharge: 2016-03-24 | Disposition: A | Discharge status: Home / Self Care | Payer: Managed Care, Other (non HMO) | Attending: Family Medicine | Admitting: Family Medicine

## 2016-03-24 DIAGNOSIS — S92902A Unspecified fracture of left foot, initial encounter for closed fracture: Secondary | ICD-10-CM

## 2016-03-24 DIAGNOSIS — S93402A Sprain of unspecified ligament of left ankle, initial encounter: Secondary | ICD-10-CM

## 2016-03-24 MED ORDER — HYDROCODONE-ACETAMINOPHEN 5-325 MG PO TABS: 1.0000 | ORAL_TABLET | Freq: Four times a day (QID) | ORAL | Status: DC | PRN

## 2016-03-24 NOTE — Discharge Instructions (Signed)
Metatarsal Fracture A metatarsal fracture is a break in a metatarsal bone. Metatarsal bones connect your toe bones to your ankle bones. CAUSES This type of fracture may be caused by:  A sudden twisting of your foot.  A fall onto your foot.  Overuse or repetitive exercise. RISK FACTORS This condition is more likely to develop in people who:  Play contact sports.  Have a bone disease.  Have a low calcium level. SYMPTOMS Symptoms of this condition include:  Pain that is worse when walking or standing.  Pain when pressing on the foot or moving the toes.  Swelling.  Bruising on the top or bottom of the foot.  A foot that appears shorter than the other one. DIAGNOSIS This condition is diagnosed with a physical exam. You may also have imaging tests, such as:  X-rays.  A CT scan.  MRI. TREATMENT Treatment for this condition depends on its severity and whether a bone has moved out of place. Treatment may involve:  Rest.  Wearing foot support such as a cast, splint, or boot for several weeks.  Using crutches.  Surgery to move bones back into the right position. Surgery is usually needed if there are many pieces of broken bone or bones that are very out of place (displaced fracture).  Physical therapy. This may be needed to help you regain full movement and strength in your foot. You will need to return to your health care provider to have X-rays taken until your bones heal. Your health care provider will look at the X-rays to make sure that your foot is healing well. HOME CARE INSTRUCTIONS  If You Have a Cast:  Do not stick anything inside the cast to scratch your skin. Doing that increases your risk of infection.  Check the skin around the cast every day. Report any concerns to your health care provider. You may put lotion on dry skin around the edges of the cast. Do not apply lotion to the skin underneath the cast.  Keep the cast clean and dry. If You Have a Splint  or a Supportive Boot:  Wear it as directed by your health care provider. Remove it only as directed by your health care provider.  Loosen it if your toes become numb and tingle, or if they turn cold and blue.  Keep it clean and dry. Bathing  Do not take baths, swim, or use a hot tub until your health care provider approves. Ask your health care provider if you can take showers. You may only be allowed to take sponge baths for bathing.  If your health care provider approves bathing and showering, cover the cast or splint with a watertight plastic bag to protect it from water. Do not let the cast or splint get wet. Managing Pain, Stiffness, and Swelling  If directed, apply ice to the injured area (if you have a splint, not a cast).  Put ice in a plastic bag.  Place a towel between your skin and the bag.  Leave the ice on for 20 minutes, 2-3 times per day.  Move your toes often to avoid stiffness and to lessen swelling.  Raise (elevate) the injured area above the level of your heart while you are sitting or lying down. Driving  Do not drive or operate heavy machinery while taking pain medicine.  Do not drive while wearing foot support on a foot that you use for driving. Activity  Return to your normal activities as directed by your health care  provider. Ask your health care provider what activities are safe for you.  Perform exercises as directed by your health care provider or physical therapist. Safety  Do not use the injured foot to support your body weight until your health care provider says that you can. Use crutches as directed by your health care provider. General Instructions  Do not put pressure on any part of the cast or splint until it is fully hardened. This may take several hours.  Do not use any tobacco products, including cigarettes, chewing tobacco, or e-cigarettes. Tobacco can delay bone healing. If you need help quitting, ask your health care  provider.  Take medicines only as directed by your health care provider.  Keep all follow-up visits as directed by your health care provider. This is important. SEEK MEDICAL CARE IF:  You have a fever.  Your cast, splint, or boot is too loose or too tight.  Your cast, splint, or boot is damaged.  Your pain medicine is not helping.  You have pain, tingling, or numbness in your foot that is not going away. SEEK IMMEDIATE MEDICAL CARE IF:  You have severe pain.  You have tingling or numbness in your foot that is getting worse.  Your foot feels cold or becomes numb.  Your foot changes color.   This information is not intended to replace advice given to you by your health care provider. Make sure you discuss any questions you have with your health care provider.   Document Released: 05/17/2002 Document Revised: 01/09/2015 Document Reviewed: 06/21/2014 Elsevier Interactive Patient Education 2016 [employer] Cryotherapy Cryotherapy is when you put ice on your injury. Ice helps lessen pain and puffiness (swelling) after an injury. Ice works the best when you start using it in the first 24 to 48 hours after an injury. HOME CARE  Put a dry or damp towel between the ice pack and your skin.  You may press gently on the ice pack.  Leave the ice on for no more than 10 to 20 minutes at a time.  Check your skin after 5 minutes to make sure your skin is okay.  Rest at least 20 minutes between ice pack uses.  Stop using ice when your skin loses feeling (numbness).  Do not use ice on someone who cannot tell you when it hurts. This includes small children and people with memory problems (dementia). GET HELP RIGHT AWAY IF:  You have white spots on your skin.  Your skin turns blue or pale.  Your skin feels waxy or hard.  Your puffiness gets worse. MAKE SURE YOU:   Understand these instructions.  Will watch your condition.  Will get help right away if you are not doing well  or get worse.   This information is not intended to replace advice given to you by your health care provider. Make sure you discuss any questions you have with your health care provider.   Document Released: 02/11/2008 Document Revised: 11/17/2011 Document Reviewed: 04/17/2011 Elsevier Interactive Patient Education [employer]

## 2016-03-24 NOTE — ED Notes (Signed)
Pt reports she inverted her left ankle/foot onset 1530 today... Sx include swelling and pain Brought back in wheelchair... Also has abrasions to left knee A&O x4... NAD

## 2016-03-24 NOTE — ED Provider Notes (Signed)
CSN: [id number]     Arrival date & time 03/24/16  1754 History   First MD Initiated Contact with Patient 03/24/16 1821     Chief Complaint  Patient presents with  . Foot Injury   (Consider location/radiation/quality/duration/timing/severity/associated sxs/prior Treatment) HPI  Past Medical History  Diagnosis Date  . GERD (gastroesophageal reflux disease)   . Crohn's colitis (HCC)   . Nephrolithiasis   . ADD (attention deficit disorder)   . Depression with anxiety   . History of iron deficiency anemia    Past Surgical History  Procedure Laterality Date  . Cholecystectomy    . Cesarean section      x 3  . Tonsillectomy  1974  . Colonoscopy w/ biopsies and polypectomy  12/24/2009    pseudopolyps (hyperplastic), crohn's   . Upper gastrointestinal endoscopy  12/24/2009    w/bx, gastritis   Family History  Problem Relation Age of Onset  . Crohn's disease Mother   . Crohn's disease      grandmother  . Colon cancer      great grandmother   Social History  Substance Use Topics  . Smoking status: Former Games developer  . Smokeless tobacco: None  . Alcohol Use: None   OB History    No data available     Review of Systems  Allergies  Metronidazole and Penicillins  Home Medications   Prior to Admission medications   Medication Sig Start Date End Date Taking? Authorizing Provider  calcium carbonate (TUMS - DOSED IN MG ELEMENTAL CALCIUM) 500 MG chewable tablet Chew 1 tablet by mouth as needed.    Historical Provider, MD  dexlansoprazole (DEXILANT) 60 MG capsule Take 60 mg by mouth daily.    Historical Provider, MD  FLUoxetine (PROZAC) 40 MG capsule Take 40 mg by mouth 2 (two) times daily.    Historical Provider, MD   Meds Ordered and Administered this Visit  Medications - No data to display  BP 122/73 mmHg  Pulse 94  Temp(Src) 98.3 F (36.8 C) (Oral)  Resp 18  SpO2 100% No data found.   Physical Exam  Constitutional:  NURSES NOTES AND VITAL SIGNS REVIEWED.  NURSES  NOTES AND VITAL SIGNS REVIEWED. CONSTITUTIONAL: Well developed, well nourished, no acute distress HEENT: normocephalic, atraumatic EYES: Conjunctiva normal NECK:normal ROM, supple, no adenopathy PULMONARY:No respiratory distress, normal effort ABDOMINAL: Soft, ND, NT BS+, No CVAT   SKIN: warm and dry without rash PSYCHIATRIC: Mood and affect, behavior are normal      Musculoskeletal:       Feet:  .fc  Nursing note and vitals reviewed.     ED Course  Procedures (including critical care time)  Labs Review Labs Reviewed - No data to display  Imaging Review Dg Ankle Complete Left  03/24/2016  CLINICAL DATA:  Status post fall on concrete patio today with a left ankle and foot injury. Pain. EXAM: LEFT ANKLE COMPLETE - 3+ VIEW COMPARISON:  None. FINDINGS: There is a fracture of the base of the fifth metatarsal. No other acute bony or joint abnormality is identified. IMPRESSION: Acute fracture base of the left fifth metatarsal. Electronically Signed   By: Drusilla Kanner M.D.   On: 03/24/2016 18:58   Dg Foot Complete Left  03/24/2016  CLINICAL DATA:  The patient twisted her left ankle Implanon had yet today. Swelling and pain. Initial encounter. EXAM: LEFT FOOT - COMPLETE 3+ VIEW COMPARISON:  None. FINDINGS: A nondisplaced acute fracture is seen at the base of the fifth metatarsal. No  other acute bony or joint abnormality is identified. IMPRESSION: Acute fracture base of the left fifth metatarsal. Electronically Signed   By: Drusilla Kanner M.D.   On: 03/24/2016 18:59     Visual Acuity Review  Right Eye Distance:   Left Eye Distance:   Bilateral Distance:    Right Eye Near:   Left Eye Near:    Bilateral Near:      CAM WALKER, CRUTCHES, PAIN MEDICATION REFER TO TRIAD FOOT   MDM   1. Foot fracture, left, closed, initial encounter   2. Ankle sprain, left, initial encounter     Patient is reassured that there are no issues that require transfer to higher level of care at  this time or additional tests. Patient is advised to continue home symptomatic treatment. Patient is advised that if there are new or worsening symptoms to attend the emergency department, contact primary care provider, or return to UC. Instructions of care provided discharged home in stable condition.    THIS NOTE WAS GENERATED USING A VOICE RECOGNITION SOFTWARE PROGRAM. ALL REASONABLE EFFORTS  WERE MADE TO PROOFREAD THIS DOCUMENT FOR ACCURACY.  I have verbally reviewed the discharge instructions with the patient. A printed AVS was given to the patient.  All questions were answered prior to discharge.      Tharon Aquas, PA 03/24/16 Ernestina [place]

## 2016-07-08 ENCOUNTER — Other Ambulatory Visit: Payer: Self-pay | Admitting: Obstetrics and Gynecology

## 2016-07-09 LAB — CYTOLOGY - PAP

## 2018-02-11 DIAGNOSIS — L255 Unspecified contact dermatitis due to plants, except food: Secondary | ICD-10-CM | POA: Diagnosis not present

## 2018-04-22 ENCOUNTER — Encounter: Payer: Self-pay | Admitting: Family Medicine

## 2018-04-22 ENCOUNTER — Ambulatory Visit (INDEPENDENT_AMBULATORY_CARE_PROVIDER_SITE_OTHER): Payer: 59 | Admitting: Family Medicine

## 2018-04-22 VITALS — BP 108/83 | HR 72 | Ht 63.0 in | Wt 138.4 lb

## 2018-04-22 DIAGNOSIS — Z716 Tobacco abuse counseling: Secondary | ICD-10-CM | POA: Insufficient documentation

## 2018-04-22 DIAGNOSIS — B070 Plantar wart: Secondary | ICD-10-CM

## 2018-04-22 DIAGNOSIS — F17200 Nicotine dependence, unspecified, uncomplicated: Secondary | ICD-10-CM | POA: Diagnosis not present

## 2018-04-22 DIAGNOSIS — K50119 Crohn's disease of large intestine with unspecified complications: Secondary | ICD-10-CM

## 2018-04-22 DIAGNOSIS — F341 Dysthymic disorder: Secondary | ICD-10-CM

## 2018-04-22 HISTORY — DX: Plantar wart: B07.0

## 2018-04-22 NOTE — Patient Instructions (Signed)
Please make a follow-up appointment for a yearly physical, come fasting as we will get full panel of screening blood work on you.  Also if you would like, make a follow-up after that visit in about 1 week so we can review and discuss all your labs as we discussed you would prefer.  Also additionally, please come in in the near future for our quick visit so we can treat your plantar wart.  Again, this usually takes repeated liquid nitrogen treatments and cryotherapy office visits approximately 7 to 10 days apart in order to kill the virus  Please realize, EXERCISE IS MEDICINE!  -  American Heart Association [facility]) guidelines for exercise : If you are in good health, without any medical conditions, you should engage in 150 minutes of moderate intensity aerobic activity per week.  This means you should be huffing and puffing throughout your workout.   Engaging in regular exercise will improve brain function and memory, as well as improve mood, boost immune system and help with weight management.  As well as the other, more well-known effects of exercise such as decreasing blood sugar levels, decreasing blood pressure,  and decreasing bad cholesterol levels/ increasing good cholesterol levels.     -  The AHA strongly endorses consumption of a diet that contains a variety of foods from all the food categories with an emphasis on fruits and vegetables; fat-free and low-fat dairy products; cereal and grain products; legumes and nuts; and fish, poultry, and/or extra lean meats.    Excessive food intake, especially of foods high in saturated and trans fats, sugar, and salt, should be avoided.    Adequate water intake of roughly 1/2 of your weight in pounds, should equal the ounces of water per day you should drink.  So for instance, if you're 200 pounds, that would be 100 ounces of water per day.         Mediterranean Diet  Why follow it? Research shows. . Those who follow the Mediterranean diet have a  reduced risk of heart disease  . The diet is associated with a reduced incidence of Parkinson's and Alzheimer's diseases . People following the diet may have longer life expectancies and lower rates of chronic diseases  . The Dietary Guidelines for Americans recommends the Mediterranean diet as an eating plan to promote health and prevent disease  What Is the Mediterranean Diet?  . Healthy eating plan based on typical foods and recipes of Mediterranean-style cooking . The diet is primarily a plant based diet; these foods should make up a majority of meals   Starches - Plant based foods should make up a majority of meals - They are an important sources of vitamins, minerals, energy, antioxidants, and fiber - Choose whole grains, foods high in fiber and minimally processed items  - Typical grain sources include wheat, oats, barley, corn, brown rice, bulgar, farro, millet, polenta, couscous  - Various types of beans include chickpeas, lentils, fava beans, black beans, white beans   Fruits  Veggies - Large quantities of antioxidant rich fruits & veggies; 6 or more servings  - Vegetables can be eaten raw or lightly drizzled with oil and cooked  - Vegetables common to the traditional Mediterranean Diet include: artichokes, arugula, beets, broccoli, brussel sprouts, cabbage, carrots, celery, collard greens, cucumbers, eggplant, kale, leeks, lemons, lettuce, mushrooms, okra, onions, peas, peppers, potatoes, pumpkin, radishes, rutabaga, shallots, spinach, sweet potatoes, turnips, zucchini - Fruits common to the Mediterranean Diet include: apples, apricots, avocados, cherries,  clementines, dates, figs, grapefruits, grapes, melons, nectarines, oranges, peaches, pears, pomegranates, strawberries, tangerines  Fats - Replace butter and margarine with healthy oils, such as olive oil, canola oil, and tahini  - Limit nuts to no more than a handful a day  - Nuts include walnuts, almonds, pecans, pistachios, pine  nuts  - Limit or avoid candied, honey roasted or heavily salted nuts - Olives are central to the Mediterranean diet - can be eaten whole or used in a variety of dishes   Meats Protein - Limiting red meat: no more than a few times a month - When eating red meat: choose lean cuts and keep the portion to the size of deck of cards - Eggs: approx. 0 to 4 times a week  - Fish and lean poultry: at least 2 a week  - Healthy protein sources include, chicken, [place], lean beef, lamb - Increase intake of seafood such as tuna, salmon, trout, mackerel, shrimp, scallops - Avoid or limit high fat processed meats such as sausage and bacon  Dairy - Include moderate amounts of low fat dairy products  - Focus on healthy dairy such as fat free yogurt, skim milk, low or reduced fat cheese - Limit dairy products higher in fat such as whole or 2% milk, cheese, ice cream  Alcohol - Moderate amounts of red wine is ok  - No more than 5 oz daily for women (all ages) and men older than age 49  - No more than 10 oz of wine daily for men younger than 46  Other - Limit sweets and other desserts  - Use herbs and spices instead of salt to flavor foods  - Herbs and spices common to the traditional Mediterranean Diet include: basil, bay leaves, chives, cloves, cumin, fennel, garlic, lavender, marjoram, mint, oregano, parsley, pepper, rosemary, sage, savory, sumac, tarragon, thyme   It's not just a diet, it's a lifestyle:  . The Mediterranean diet includes lifestyle factors typical of those in the region  . Foods, drinks and meals are best eaten with others and savored . Daily physical activity is important for overall good health . This could be strenuous exercise like running and aerobics . This could also be more leisurely activities such as walking, housework, yard-work, or taking the stairs . Moderation is the key; a balanced and healthy diet accommodates most foods and drinks . Consider portion sizes and frequency of  consumption of certain foods   Meal Ideas & Options:  . Breakfast:  o Whole wheat toast or whole wheat English muffins with peanut butter & hard boiled egg o Steel cut oats topped with apples & cinnamon and skim milk  o Fresh fruit: banana, strawberries, melon, berries, peaches  o Smoothies: strawberries, bananas, greek yogurt, peanut butter o Low fat greek yogurt with blueberries and granola  o Egg white omelet with spinach and mushrooms o Breakfast couscous: whole wheat couscous, apricots, skim milk, cranberries  . Sandwiches:  o Hummus and grilled vegetables (peppers, zucchini, squash) on whole wheat bread   o Grilled chicken on whole wheat pita with lettuce, tomatoes, cucumbers or tzatziki  o Tuna salad on whole wheat bread: tuna salad made with greek yogurt, olives, red peppers, capers, green onions o Garlic rosemary lamb pita: lamb sauted with garlic, rosemary, salt & pepper; add lettuce, cucumber, greek yogurt to pita - flavor with lemon juice and black pepper  . Seafood:  o Mediterranean grilled salmon, seasoned with garlic, basil, parsley, lemon juice and black pepper  o Shrimp, lemon, and spinach whole-grain pasta salad made with low fat greek yogurt  o Seared scallops with lemon orzo  o Seared tuna steaks seasoned salt, pepper, coriander topped with tomato mixture of olives, tomatoes, olive oil, minced garlic, parsley, green onions and cappers  . Meats:  o Herbed greek chicken salad with kalamata olives, cucumber, feta  o Red bell peppers stuffed with spinach, bulgur, lean ground beef (or lentils) & topped with feta   o Kebabs: skewers of chicken, tomatoes, onions, zucchini, squash  o [place] burgers: made with red onions, mint, dill, lemon juice, feta cheese topped with roasted red peppers . Vegetarian o Cucumber salad: cucumbers, artichoke hearts, celery, red onion, feta cheese, tossed in olive oil & lemon juice  o Hummus and whole grain pita points with a greek salad  (lettuce, tomato, feta, olives, cucumbers, red onion) o Lentil soup with celery, carrots made with vegetable broth, garlic, salt and pepper  o Tabouli salad: parsley, bulgur, mint, scallions, cucumbers, tomato, radishes, lemon juice, olive oil, salt and pepper.

## 2018-04-22 NOTE — Progress Notes (Signed)
New patient office visit note:  Impression and Recommendations:    1. ANXIETY DEPRESSION   2. Crohn's disease of large intestine with complication (HCC)   3. Smoker   4. Tobacco abuse counseling   5. Plantar wart of left foot    1.  We discussed counseling, regular exercise and prudent diet as txmnt for mood.  Declines meds but we discussed alternative txmnts  2.  Smokes pot daily for txmnt of her Crohns.  We discussed risks associated and that I don't rec it, but pt says she has tried several from GI and none work anywhere near as well as THC.     3/ 4.   Told pt to think seriously about quitting smoking!  Told pt it is very important for her health and well being.   -Smoking cessation instruction/counseling given:  counseled patient on the dangers of tobacco use, advised patient to stop smoking, and reviewed strategies to maximize success -Discussed with patient that there are multiple treatments to aid in quitting smoking, however I explained none will work unless pt really want to quit -Told to call 1-800-QUIT-NOW (973) 402-5066) for free smoking cessation counseling and support, or pt can go online to www.heart.org - the American Heart Association website and search "quit smoking ".    5.  Discussed with the patient OTC and clinic administered treatment methods to aid in dealing with the left plantar wart. Patient advised to follow up in the near future for the nitrogen procedure if desired.    Education and routine counseling performed. Handouts provided.   Gross side effects, risk and benefits, and alternatives of medications discussed with patient.  Patient is aware that all medications have potential side effects and we are unable to predict every side effect or drug-drug interaction that may occur.  Expresses verbal understanding and consents to current therapy plan and treatment regimen.  Return for CPE/ yrly physical, come fasting, then OV with me 1 wk later; ALso-  for plantar wart txmnt.  Please see AVS handed out to patient at the end of our visit for further patient instructions/ counseling done pertaining to today's office visit.    Note: This document was prepared using Dragon voice recognition software and may include unintentional dictation errors.  This document serves as a record of services personally performed by [name], DO. It was created on her behalf by Chestine Spore, a trained medical scribe. The creation of this record is based on the scribe's personal observations and the provider's statements to them.   I have reviewed the above medical documentation for accuracy and completeness and I concur.  [name] 04/25/18 7:51 PM    ----------------------------------------------------------------------------------------------------------------------    Subjective:    Chief complaint:   Chief Complaint  Patient presents with  . Establish Care     HPI: Sydney [name] is a pleasant 47 y.o. female who presents to [facility] today to review their medical history with me and establish care.   I asked the patient to review their chronic problem list with me to ensure everything was updated and accurate.    All recent office visits with other providers, any medical records that patient brought in etc  - I reviewed today.     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  Told them to call their specialists to clarify this if they are not sure.   Specialist:  Dr. Henderson Cloud is her GYN specialist and she had a total hysterectomy, and she still receives a pap smear.   PMHx:  She has a hx of Chron's disease which was diagnosed at age 60 via colonoscopy. She has a GI specialist, Dr. Leone Payor at [facility], who  she doesn't see often. She self treats with marijuana when she has flares of her Chron's disease. She also uses CBD oil to help with her headaches due to taking too much ibuprofen. She uses marijuana via a bowl daily and she has been smoking marijuana while as a teen and started back 7 years ago.   She takes Prozac for depression for many years. She denies ever having to be hospitalized for her depression. 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   She also notes that she has a left plantar wart that she has had for several years and she would like to be evaluated in the near future.   She denies a hx of HLD.   Social Hx:  She notes that she smokes 2 swishers daily, however, sometimes she doesn't smoke a full swisher. She notes that she used to not smoke, however, she picked the habit back up 7 years ago.   She has 1 sister and a half brother. She has 3 kids with her youngest being a 35 year old girl. 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 Her husband is Warnell Forester.   Family Hx:  She had her maternal great-grandmother diagnosed with colon cancer and her maternal great-aunt with breast cancer.   Exercise management:  She works out 7 days a week, 30 minutes at a time.    Wt Readings from Last 3 Encounters:  05/06/18 138 lb (62.6 kg)  04/27/18 138 lb (62.6 kg)  04/22/18 138 lb 6.4 oz (62.8 kg)   BP Readings from Last 3 Encounters:  05/06/18 130/86  04/27/18 115/69  04/22/18 108/83   Pulse Readings from Last 3 Encounters:  05/06/18 76  04/27/18 82  04/22/18 72   BMI Readings from Last 3 Encounters:  05/06/18 24.45 kg/m  04/27/18 24.45 kg/m  04/22/18 24.52 kg/m    Patient Care Team    Relationship Specialty Notifications Start End  [name], DO PCP - General Family Medicine  04/22/18   Carrington Clamp, MD Consulting Physician  Obstetrics and Gynecology  04/22/18     Patient Active Problem List   Diagnosis Date Noted  . Smoker 04/22/2018  . Tobacco abuse counseling 04/22/2018  . Plantar wart of left foot 04/22/2018  . CROHN'S DISEASE, LARGE INTESTINE 12/28/2009  . ANXIETY DEPRESSION 12/17/2009  . DYSPHAGIA 12/17/2009  . GERD 01/21/2008  . HIATAL HERNIA 01/21/2008     Past Medical History:  Diagnosis Date  . ADD (attention deficit disorder)   . Crohn disease (HCC)   . Crohn's colitis (HCC)   . Depression with anxiety   . GERD (gastroesophageal reflux disease)   . History of iron deficiency anemia   . Nephrolithiasis      Past Medical History:  Diagnosis Date  . ADD (attention deficit disorder)   . Crohn disease (HCC)   . Crohn's colitis (HCC)   .  Depression with anxiety   . GERD (gastroesophageal reflux disease)   . History of iron deficiency anemia   . Nephrolithiasis      Past Surgical History:  Procedure Laterality Date  . ABDOMINAL HYSTERECTOMY  2011  . CESAREAN SECTION     x 3  . CHOLECYSTECTOMY    . COLONOSCOPY W/ BIOPSIES AND POLYPECTOMY  12/24/2009   pseudopolyps (hyperplastic), crohn's   . TONSILLECTOMY  1974  . UPPER GASTROINTESTINAL ENDOSCOPY  12/24/2009   w/bx, gastritis     Family History  Problem Relation Age of Onset  . Crohn's disease Mother   . Crohn's disease Unknown        grandmother  . Colon cancer Unknown        great grandmother     Social History   Substance and Sexual Activity  Drug Use Never     Social History   Substance and Sexual Activity  Alcohol Use Yes   Comment: occ.     Social History   Tobacco Use  Smoking Status Current Every Day Smoker  . Types: Cigars  Smokeless Tobacco Never Used  Tobacco Comment   2 small cigars a day     Current Meds  Medication Sig  . FLUoxetine (PROZAC) 40 MG capsule Take 1 capsule by mouth daily.    Allergies: Metronidazole and Penicillins   Review of Systems  Constitutional: Negative  for chills, diaphoresis, fever, malaise/fatigue and weight loss.  HENT: Negative for congestion, sore throat and tinnitus.   Eyes: Negative for blurred vision, double vision and photophobia.  Respiratory: Negative for cough and wheezing.   Cardiovascular: Negative for chest pain and palpitations.  Gastrointestinal: Negative for blood in stool, diarrhea, nausea and vomiting.  Genitourinary: Negative for dysuria, frequency and urgency.  Musculoskeletal: Negative for joint pain and myalgias.  Skin: Negative for itching and rash.  Neurological: Negative for dizziness, focal weakness, weakness and headaches.  Endo/Heme/Allergies: Negative for environmental allergies and polydipsia. Does not bruise/bleed easily.  Psychiatric/Behavioral: Negative for depression and memory loss. The patient is not nervous/anxious and does not have insomnia.      Objective:   Blood pressure 108/83, pulse 72, height 5\' 3"  (1.6 m), weight 138 lb 6.4 oz (62.8 kg), SpO2 99 %. Body mass index is 24.52 kg/m. General: Well Developed, well nourished, and in no acute distress.  Neuro: Alert and oriented x3, extra-ocular muscles intact, sensation grossly intact.  HEENT:[place]/AT, PERRLA, neck supple, No carotid bruits Skin: no gross rashes  Cardiac: Regular rate and rhythm Respiratory: Essentially clear to auscultation bilaterally. Not using accessory muscles, speaking in full sentences.  Abdominal: not grossly distended Musculoskeletal: Ambulates w/o diff, FROM * 4 ext.  Vasc: less 2 sec cap RF, warm and pink  Psych:  No HI/SI, judgement and insight good, Euthymic mood. Full Affect.    No results found for this or any previous visit (from the past 2160 hour(s)).

## 2018-04-27 ENCOUNTER — Ambulatory Visit (INDEPENDENT_AMBULATORY_CARE_PROVIDER_SITE_OTHER): Payer: 59 | Admitting: Family Medicine

## 2018-04-27 ENCOUNTER — Encounter: Payer: Self-pay | Admitting: Family Medicine

## 2018-04-27 VITALS — BP 115/69 | HR 82 | Ht 63.0 in | Wt 138.0 lb

## 2018-04-27 DIAGNOSIS — B070 Plantar wart: Secondary | ICD-10-CM | POA: Diagnosis not present

## 2018-04-27 DIAGNOSIS — N390 Urinary tract infection, site not specified: Secondary | ICD-10-CM | POA: Diagnosis not present

## 2018-04-27 NOTE — Patient Instructions (Signed)
Plantar Warts Warts are small growths on the skin. They can occur on various areas of the body. When they occur on the underside (sole) of the foot, they are called plantar warts. Plantar warts often occur in groups, with several small warts around a larger growth. They tend to develop over areas of pressure, such as the heel or the ball of the foot. Most warts are not painful, and they usually do not cause problems. However, plantar warts may cause pain when you walk because pressure is applied to them. Warts often go away on their own in time. Various treatments may be done if needed. Sometimes, warts go away and then they come back again. What are the causes? Plantar warts are caused by a type of virus that is called human papillomavirus (HPV). HPV attacks a break in the skin of the foot. Walking barefoot can lead to exposure to the virus. These warts may spread to other areas of the sole. They spread to other areas of the body only through direct contact. What increases the risk? Plantar warts are more likely to develop in:  People who are 10-20 years of age.  People who use public showers or locker rooms.  People who have a weakened body defense system (immune system).  What are the signs or symptoms? Plantar warts may be flat or slightly raised. They may grow into the deeper layers of skin or rise above the surface of the skin. Most plantar warts have a rough surface. They may cause pain when you use your foot to support your body weight. How is this diagnosed? A plantar wart can usually be diagnosed from its appearance. In some cases, a tissue sample may be removed (biopsy) to be looked at under a microscope. How is this treated? In many cases, warts do not need treatment. Without treatment, they often go away over a period of many months to a couple years. If treatment is needed, options may include:  Applying medicated solutions, creams, or patches to the wart. These may be  over-the-counter or prescription medicines that make the skin soft so that layers will gradually shed away. In many cases, the medicine is applied one or two times per day and covered with a bandage.  Putting duct tape over the top of the wart (occlusion). You will leave the tape in place for as long as told by your health care provider, then you will replace it with a new strip of tape. This is done until the wart goes away.  Freezing the wart with liquid nitrogen (cryotherapy).  Burning the wart with: ? Laser treatment. ? An electrified probe (electrocautery).  Injection of a medicine (Candida antigen) into the wart to help the body's immune system to fight off the wart.  Surgery to remove the wart.  Follow these instructions at home:  Apply medicated creams or solutions only as told by your health care provider. This may involve: ? Soaking the affected area in warm water. ? Removing the top layer of softened skin before you apply the medicine. A pumice stone works well for removing the tissue. ? Applying a bandage over the affected area after you apply the medicine. ? Repeating the process daily or as told by your health care provider.  Do not scratch or pick at a wart.  Wash your hands after you touch a wart.  If a wart is painful, try applying a bandage with a hole in the middle over the wart. The helps to take   pressure off the wart.  Keep all follow-up visits as told by your health care provider. This is important. How is this prevented? Take these actions to help prevent warts:  Wear shoes and socks. Change your socks daily.  Keep your feet clean and dry.  Check your feet regularly.  Avoid direct contact with warts on other people.  Contact a health care provider if:  Your warts do not improve after treatment.  You have redness, swelling, or pain at the site of a wart.  You have bleeding from a wart that does not stop with light pressure.  You have diabetes and  you develop a wart. This information is not intended to replace advice given to you by your health care provider. Make sure you discuss any questions you have with your health care provider. Document Released: 11/15/2003 Document Revised: 01/31/2016 Document Reviewed: 11/20/2014 Elsevier Interactive Patient Education  2018 [employer]  

## 2018-04-27 NOTE — Progress Notes (Signed)
Pt here for an acute care OV today   Impression and Recommendations:    1. Plantar wart of left foot      Indication: Wart(s)  Medical necessity statement:  On physical examination, verruca noted on heel of left foot.  Consent:  Discussed benefits and risks of procedure and verbal consent obtained from the patient.  Procedure:  Patient was prepped in clean fashion for the procedure.   Hyperkeratotic skin was shaved off from the surface of the verruca -down to the base were it just started to bleed.  Liquid nitrogen was placed onto skin lesion for 20 seconds minimum until entire base turned white.   Pt tolerated well. No complications  Blood Loss:  less than 1 cc  Post-Care Instructions:   Proper wound care discussed with patient.  If wound does not continue improve as expected, patient told to notify us immediately  Patient was told that: -The skin will blister.   Advised that it should take at least 10-14 days to heal before second treatment can be administered.    - Keep the area and blisters covered, clean, and dry.  Do not pop them, but if they pop naturally, let them.  - Reviewed that warts are extremely contagious.  Advised patient to wear shower shoes in her bathroom, gyms etc.  - Return in 7-10 days for follow-up with cryotherapy 2nd treatment.    Education and routine counseling performed. Handouts provided  Gross side effects, risk and benefits, and alternatives of medications and treatment plan in general discussed with patient.  Patient is aware that all medications have potential side effects and we are unable to predict every side effect or drug-drug interaction that may occur.   Patient will call with any questions prior to using medication if they have concerns.  Expresses verbal understanding and consents to current therapy and treatment regimen.  No barriers to understanding were identified.  Red flag symptoms and signs discussed in detail.  Patient expressed  understanding regarding what to do in case of emergency\urgent symptoms   Please see AVS handed out to patient at the end of our visit for further patient instructions/ counseling done pertaining to today's office visit.   Return for 7-10 days 2nd txmnt.     Note:  This document was prepared occasionally using Dragon voice recognition software and may include unintentional dictation errors   Sydney [name] 04/27/18 3:55 PM  --------------------------------------------------------------------------------------------------------------------------------------------------------------------------------------------------------------------------------------------    Subjective:    CC:  Chief Complaint  Patient presents with  . Procedure    HPI: Sydney [name] is a 47 y.o. female who presents to [facility] at [facility] today for issues as discussed below.  Patient has had plantar wart bottom of left foot for many weeks.  She has been working on home treatments of trying to "cut it out herself "and use "over-the-counter medications for warts".    It has gotten larger and more painful over time.  She is here for cryotherapy and to discuss definitive treatments for goal of cure    Wt Readings from Last 3 Encounters:  04/27/18 138 lb (62.6 kg)  04/22/18 138 lb 6.4 oz (62.8 kg)   BP Readings from Last 3 Encounters:  04/27/18 115/69  04/22/18 108/83  03/24/16 122/73   BMI Readings from Last 3 Encounters:  04/27/18 24.45 kg/m  04/22/18 24.52 kg/m     Patient Care Team    Relationship Specialty Notifications Start End  Sydney Lot, DO  PCP - General Family Medicine  04/22/18   Carrington Clamp, MD Consulting Physician Obstetrics and Gynecology  04/22/18      Patient Active Problem List   Diagnosis Date Noted  . Smoker 04/22/2018  . Tobacco abuse counseling 04/22/2018  . Plantar wart of left foot 04/22/2018  . CROHN'S DISEASE, LARGE INTESTINE 12/28/2009    . ANXIETY DEPRESSION 12/17/2009  . DYSPHAGIA 12/17/2009  . GERD 01/21/2008  . HIATAL HERNIA 01/21/2008    Past Medical history, Surgical history, Family history, Social history, Allergies and Medications have been entered into the medical record, reviewed and changed as needed.    Current Meds  Medication Sig  . FLUoxetine (PROZAC) 40 MG capsule Take 1 capsule by mouth daily.    Allergies:  Allergies  Allergen Reactions  . Metronidazole   . Penicillins    Review of Systems:  A fourteen system review of systems was performed and found to be positive as per HPI.  Objective:   Blood pressure 115/69, pulse 82, height 5\' 3"  (1.6 m), weight 138 lb (62.6 kg). Body mass index is 24.45 kg/m. General:  Well Developed, well nourished, appropriate for stated age.  Neuro:  Alert and oriented,  extra-ocular muscles intact  HEENT:  Normocephalic, atraumatic, neck supple Skin:  no gross rash, warm, pink.  Plantar wart approximately 5- 6 mm diameter and depth to about 1-1.5 mm depth. Respiratory:  speaking in full sentences- unlabored. Vascular:  Ext warm, no cyanosis apprec.; cap RF less 2 sec. Psych:  No HI/SI, judgement and insight good, Euthymic mood. Full Affect.

## 2018-05-06 ENCOUNTER — Ambulatory Visit: Payer: 59 | Admitting: Family Medicine

## 2018-05-06 ENCOUNTER — Encounter: Payer: Self-pay | Admitting: Family Medicine

## 2018-05-06 VITALS — BP 130/86 | HR 76 | Ht 63.0 in | Wt 138.0 lb

## 2018-05-06 DIAGNOSIS — B070 Plantar wart: Secondary | ICD-10-CM

## 2018-05-06 NOTE — Patient Instructions (Signed)
Plantar Warts Warts are small growths on the skin. They can occur on various areas of the body. When they occur on the underside (sole) of the foot, they are called plantar warts. Plantar warts often occur in groups, with several small warts around a larger growth. They tend to develop over areas of pressure, such as the heel or the ball of the foot. Most warts are not painful, and they usually do not cause problems. However, plantar warts may cause pain when you walk because pressure is applied to them. Warts often go away on their own in time. Various treatments may be done if needed. Sometimes, warts go away and then they come back again. What are the causes? Plantar warts are caused by a type of virus that is called human papillomavirus (HPV). HPV attacks a break in the skin of the foot. Walking barefoot can lead to exposure to the virus. These warts may spread to other areas of the sole. They spread to other areas of the body only through direct contact. What increases the risk? Plantar warts are more likely to develop in:  People who are 10-20 years of age.  People who use public showers or locker rooms.  People who have a weakened body defense system (immune system).  What are the signs or symptoms? Plantar warts may be flat or slightly raised. They may grow into the deeper layers of skin or rise above the surface of the skin. Most plantar warts have a rough surface. They may cause pain when you use your foot to support your body weight. How is this diagnosed? A plantar wart can usually be diagnosed from its appearance. In some cases, a tissue sample may be removed (biopsy) to be looked at under a microscope. How is this treated? In many cases, warts do not need treatment. Without treatment, they often go away over a period of many months to a couple years. If treatment is needed, options may include:  Applying medicated solutions, creams, or patches to the wart. These may be  over-the-counter or prescription medicines that make the skin soft so that layers will gradually shed away. In many cases, the medicine is applied one or two times per day and covered with a bandage.  Putting duct tape over the top of the wart (occlusion). You will leave the tape in place for as long as told by your health care provider, then you will replace it with a new strip of tape. This is done until the wart goes away.  Freezing the wart with liquid nitrogen (cryotherapy).  Burning the wart with: ? Laser treatment. ? An electrified probe (electrocautery).  Injection of a medicine (Candida antigen) into the wart to help the body's immune system to fight off the wart.  Surgery to remove the wart.  Follow these instructions at home:  Apply medicated creams or solutions only as told by your health care provider. This may involve: ? Soaking the affected area in warm water. ? Removing the top layer of softened skin before you apply the medicine. A pumice stone works well for removing the tissue. ? Applying a bandage over the affected area after you apply the medicine. ? Repeating the process daily or as told by your health care provider.  Do not scratch or pick at a wart.  Wash your hands after you touch a wart.  If a wart is painful, try applying a bandage with a hole in the middle over the wart. The helps to take   pressure off the wart.  Keep all follow-up visits as told by your health care provider. This is important. How is this prevented? Take these actions to help prevent warts:  Wear shoes and socks. Change your socks daily.  Keep your feet clean and dry.  Check your feet regularly.  Avoid direct contact with warts on other people.  Contact a health care provider if:  Your warts do not improve after treatment.  You have redness, swelling, or pain at the site of a wart.  You have bleeding from a wart that does not stop with light pressure.  You have diabetes and  you develop a wart. This information is not intended to replace advice given to you by your health care provider. Make sure you discuss any questions you have with your health care provider. Document Released: 11/15/2003 Document Revised: 01/31/2016 Document Reviewed: 11/20/2014 Elsevier Interactive Patient Education  2018 [employer]  

## 2018-05-06 NOTE — Progress Notes (Signed)
Impression and Recommendations:    1. Plantar wart of left foot     1. Indication: Wart(s)  Medical necessity statement:  On physical examination, verruca noted on heel of left foot.  Consent:  Discussed benefits and risks of procedure and verbal consent obtained from the patient.  Procedure:  Patient was prepped in clean fashion for the procedure.   Hyperkeratotic skin was shaved off from the surface of the verruca -down to the base were it just started to bleed.  Liquid nitrogen was placed onto skin lesion for 20 seconds minimum until entire base turned white.   Pt tolerated well. No complications  Blood Loss:  less than 1 cc  Post-Care Instructions:   Proper wound care discussed with patient.  If wound does not continue improve as expected, patient told to notify us immediately  Patient was told that: -The skin will blister.   Advised that it should take at least 10-14 days to heal before second treatment can be administered.    - Keep the area and blisters covered, clean, and dry.  Do not pop them, but if they pop naturally, let them.  - Reviewed that warts are extremely contagious.  Advised patient to wear shower shoes in her bathroom, gyms etc.  - Wear donut cushion on foot to alleviate pressure on wart.  - Recommended that patient use salicylic acid at home to treat area of wart.  Apply salicylic acid after every shower.  - Return in 7-10 days for follow-up with cryotherapy third treatment.     Education and routine counseling performed. Handouts provided    Please see AVS handed out to patient at the end of our visit for further patient instructions/ counseling done pertaining to today's office visit.   Return for f/up for 3rd txmnt in 7-10 days.    Note:  This note was prepared with assistance of Dragon voice recognition software. Occasional wrong-word or sound-a-like substitutions may have occurred due to the inherent limitations of voice  recognition software.   This document serves as a record of services personally performed by [name], DO. It was created on her behalf by Peggye Fothergill, a trained medical scribe. The creation of this record is based on the scribe's personal observations and the provider's statements to them.   I have reviewed the above medical documentation for accuracy and completeness and I concur.  [name] 05/07/18 3:33 PM   --------------------------------------------------------------------------------------------------------------------    Subjective:     HPI: Sydney [name] is a 47 y.o. female who presents to [facility] at [facility] today for wart removal procedure.   Wt Readings from Last 3 Encounters:  05/06/18 138 lb (62.6 kg)  04/27/18 138 lb (62.6 kg)  04/22/18 138 lb 6.4 oz (62.8 kg)   BP Readings from Last 3 Encounters:  05/06/18 130/86  04/27/18 115/69  04/22/18 108/83   Pulse Readings from Last 3 Encounters:  05/06/18 76  04/27/18 82  04/22/18 72   BMI Readings from Last 3 Encounters:  05/06/18 24.45 kg/m  04/27/18 24.45 kg/m  04/22/18 24.52 kg/m     Patient Care Team    Relationship Specialty Notifications Start End  [name], DO PCP - General Family Medicine  04/22/18   Carrington Clamp, MD Consulting Physician Obstetrics and Gynecology  04/22/18      Patient Active Problem List   Diagnosis Date Noted  . Smoker 04/22/2018  . Tobacco abuse counseling 04/22/2018  . Plantar  wart of left foot 04/22/2018  . CROHN'S DISEASE, LARGE INTESTINE 12/28/2009  . ANXIETY DEPRESSION 12/17/2009  . DYSPHAGIA 12/17/2009  . GERD 01/21/2008  . HIATAL HERNIA 01/21/2008    Past Medical history, Surgical history, Family history, Social history, Allergies and Medications have been entered into the medical record, reviewed and changed as needed.    Current Meds  Medication Sig  . FLUoxetine (PROZAC) 40 MG capsule Take 1 capsule by  mouth daily.    Allergies:  Allergies  Allergen Reactions  . Metronidazole   . Penicillins      Review of Systems:  A fourteen system review of systems was performed and found to be positive as per HPI.   Objective:   Blood pressure 130/86, pulse 76, height 5\' 3"  (1.6 m), weight 138 lb (62.6 kg), SpO2 98 %. Body mass index is 24.45 kg/m. General:  Well Developed, well nourished, appropriate for stated age.  Neuro:  Alert and oriented,  extra-ocular muscles intact  HEENT:  Normocephalic, atraumatic, neck supple, no carotid bruits appreciated  Skin:  no gross rash, warm, pink. Cardiac:  RRR, S1 S2 Respiratory:  ECTA B/L and A/P, Not using accessory muscles, speaking in full sentences- unlabored. Vascular:  Ext warm, no cyanosis apprec.; cap RF less 2 sec. Psych:  No HI/SI, judgement and insight good, Euthymic mood. Full Affect.

## 2018-05-17 ENCOUNTER — Ambulatory Visit: Payer: 59 | Admitting: Family Medicine

## 2018-06-17 ENCOUNTER — Encounter: Payer: 59 | Admitting: Family Medicine

## 2018-06-21 ENCOUNTER — Ambulatory Visit: Payer: 59 | Admitting: Family Medicine

## 2018-07-19 ENCOUNTER — Ambulatory Visit (INDEPENDENT_AMBULATORY_CARE_PROVIDER_SITE_OTHER): Payer: 59 | Admitting: Family Medicine

## 2018-07-19 ENCOUNTER — Encounter: Payer: Self-pay | Admitting: Family Medicine

## 2018-07-19 VITALS — BP 114/77 | HR 71 | Temp 98.6°F | Ht 63.0 in | Wt 140.0 lb

## 2018-07-19 DIAGNOSIS — R221 Localized swelling, mass and lump, neck: Secondary | ICD-10-CM | POA: Insufficient documentation

## 2018-07-19 DIAGNOSIS — Z719 Counseling, unspecified: Secondary | ICD-10-CM | POA: Diagnosis not present

## 2018-07-19 DIAGNOSIS — Z119 Encounter for screening for infectious and parasitic diseases, unspecified: Secondary | ICD-10-CM | POA: Diagnosis not present

## 2018-07-19 DIAGNOSIS — Z113 Encounter for screening for infections with a predominantly sexual mode of transmission: Secondary | ICD-10-CM | POA: Diagnosis not present

## 2018-07-19 DIAGNOSIS — Z0001 Encounter for general adult medical examination with abnormal findings: Secondary | ICD-10-CM | POA: Diagnosis not present

## 2018-07-19 DIAGNOSIS — Z0000 Encounter for general adult medical examination without abnormal findings: Secondary | ICD-10-CM | POA: Diagnosis not present

## 2018-07-19 DIAGNOSIS — K50119 Crohn's disease of large intestine with unspecified complications: Secondary | ICD-10-CM

## 2018-07-19 NOTE — Patient Instructions (Signed)
Preventive Care for Adults, Female  A healthy lifestyle and preventive care can promote health and wellness. Preventive health guidelines for women include the following key practices.   A routine yearly physical is a good way to check with your health care provider about your health and preventive screening. It is a chance to share any concerns and updates on your health and to receive a thorough exam.   Visit your dentist for a routine exam and preventive care every 6 months. Brush your teeth twice a day and floss once a day. Good oral hygiene prevents tooth decay and gum disease.   The frequency of eye exams is based on your age, health, family medical history, use of contact lenses, and other factors. Follow your health care provider's recommendations for frequency of eye exams.   Eat a healthy diet. Foods like vegetables, fruits, whole grains, low-fat dairy products, and lean protein foods contain the nutrients you need without too many calories. Decrease your intake of foods high in solid fats, added sugars, and salt. Eat the right amount of calories for you.Get information about a proper diet from your health care provider, if necessary.   Regular physical exercise is one of the most important things you can do for your health. Most adults should get at least 150 minutes of moderate-intensity exercise (any activity that increases your heart rate and causes you to sweat) each week. In addition, most adults need muscle-strengthening exercises on 2 or more days a week.   Maintain a healthy weight. The body mass index (BMI) is a screening tool to identify possible weight problems. It provides an estimate of body fat based on height and weight. Your health care provider can find your BMI, and can help you achieve or maintain a healthy weight.For adults 20 years and older:   - A BMI below 18.5 is considered underweight.   - A BMI of 18.5 to 24.9 is normal.   - A BMI of 25 to 29.9 is  considered overweight.   - A BMI of 30 and above is considered obese.   Maintain normal blood lipids and cholesterol levels by exercising and minimizing your intake of trans and saturated fats.  Eat a balanced diet with plenty of fruit and vegetables. Blood tests for lipids and cholesterol should begin at age 20 and be repeated every 5 years minimum.  If your lipid or cholesterol levels are high, you are over 40, or you are at high risk for heart disease, you may need your cholesterol levels checked more frequently.Ongoing high lipid and cholesterol levels should be treated with medicines if diet and exercise are not working.   If you smoke, find out from your health care provider how to quit. If you do not use tobacco, do not start.   Lung cancer screening is recommended for adults aged 55-80 years who are at high risk for developing lung cancer because of a history of smoking. A yearly low-dose CT scan of the lungs is recommended for people who have at least a 30-pack-year history of smoking and are a current smoker or have quit within the past 15 years. A pack year of smoking is smoking an average of 1 pack of cigarettes a day for 1 year (for example: 1 pack a day for 30 years or 2 packs a day for 15 years). Yearly screening should continue until the smoker has stopped smoking for at least 15 years. Yearly screening should be stopped for people who develop a   health problem that would prevent them from having lung cancer treatment.   If you are pregnant, do not drink alcohol. If you are breastfeeding, be very cautious about drinking alcohol. If you are not pregnant and choose to drink alcohol, do not have more than 1 drink per day. One drink is considered to be 12 ounces (355 mL) of beer, 5 ounces (148 mL) of wine, or 1.5 ounces (44 mL) of liquor.   Avoid use of street drugs. Do not share needles with anyone. Ask for help if you need support or instructions about stopping the use of  drugs.   High blood pressure causes heart disease and increases the risk of stroke. Your blood pressure should be checked at least yearly.  Ongoing high blood pressure should be treated with medicines if weight loss and exercise do not work.   If you are 69-55 years old, ask your health care provider if you should take aspirin to prevent strokes.   Diabetes screening involves taking a blood sample to check your fasting blood sugar level. This should be done once every 3 years, after age 38, if you are within normal weight and without risk factors for diabetes. Testing should be considered at a younger age or be carried out more frequently if you are overweight and have at least 1 risk factor for diabetes.   Breast cancer screening is essential preventive care for women. You should practice "breast self-awareness."  This means understanding the normal appearance and feel of your breasts and may include breast self-examination.  Any changes detected, no matter how small, should be reported to a health care provider.  Women in their 80s and 30s should have a clinical breast exam (CBE) by a health care provider as part of a regular health exam every 1 to 3 years.  After age 66, women should have a CBE every year.  Starting at age 1, women should consider having a mammogram (breast X-ray test) every year.  Women who have a family history of breast cancer should talk to their health care provider about genetic screening.  Women at a high risk of breast cancer should talk to their health care providers about having an MRI and a mammogram every year.   -Breast cancer gene (BRCA)-related cancer risk assessment is recommended for women who have family members with BRCA-related cancers. BRCA-related cancers include breast, ovarian, tubal, and peritoneal cancers. Having family members with these cancers may be associated with an increased risk for harmful changes (mutations) in the breast cancer genes BRCA1 and  BRCA2. Results of the assessment will determine the need for genetic counseling and BRCA1 and BRCA2 testing.   The Pap test is a screening test for cervical cancer. A Pap test can show cell changes on the cervix that might become cervical cancer if left untreated. A Pap test is a procedure in which cells are obtained and examined from the lower end of the uterus (cervix).   - Women should have a Pap test starting at age 57.   - Between ages 90 and 70, Pap tests should be repeated every 2 years.   - Beginning at age 63, you should have a Pap test every 3 years as long as the past 3 Pap tests have been normal.   - Some women have medical problems that increase the chance of getting cervical cancer. Talk to your health care provider about these problems. It is especially important to talk to your health care provider if a  new problem develops soon after your last Pap test. In these cases, your health care provider may recommend more frequent screening and Pap tests.   - The above recommendations are the same for women who have or have not gotten the vaccine for human papillomavirus (HPV).   - If you had a hysterectomy for a problem that was not cancer or a condition that could lead to cancer, then you no longer need Pap tests. Even if you no longer need a Pap test, a regular exam is a good idea to make sure no other problems are starting.   - If you are between ages 36 and 66 years, and you have had normal Pap tests going back 10 years, you no longer need Pap tests. Even if you no longer need a Pap test, a regular exam is a good idea to make sure no other problems are starting.   - If you have had past treatment for cervical cancer or a condition that could lead to cancer, you need Pap tests and screening for cancer for at least 20 years after your treatment.   - If Pap tests have been discontinued, risk factors (such as a new sexual partner) need to be reassessed to determine if screening should  be resumed.   - The HPV test is an additional test that may be used for cervical cancer screening. The HPV test looks for the virus that can cause the cell changes on the cervix. The cells collected during the Pap test can be tested for HPV. The HPV test could be used to screen women aged 70 years and older, and should be used in women of any age who have unclear Pap test results. After the age of 67, women should have HPV testing at the same frequency as a Pap test.   Colorectal cancer can be detected and often prevented. Most routine colorectal cancer screening begins at the age of 57 years and continues through age 26 years. However, your health care provider may recommend screening at an earlier age if you have risk factors for colon cancer. On a yearly basis, your health care provider may provide home test kits to check for hidden blood in the stool.  Use of a small camera at the end of a tube, to directly examine the colon (sigmoidoscopy or colonoscopy), can detect the earliest forms of colorectal cancer. Talk to your health care provider about this at age 23, when routine screening begins. Direct exam of the colon should be repeated every 5 -10 years through age 49 years, unless early forms of pre-cancerous polyps or small growths are found.   People who are at an increased risk for hepatitis B should be screened for this virus. You are considered at high risk for hepatitis B if:  -You were born in a country where hepatitis B occurs often. Talk with your health care provider about which countries are considered high risk.  - Your parents were born in a high-risk country and you have not received a shot to protect against hepatitis B (hepatitis B vaccine).  - You have HIV or AIDS.  - You use needles to inject street drugs.  - You live with, or have sex with, someone who has Hepatitis B.  - You get hemodialysis treatment.  - You take certain medicines for conditions like cancer, organ  transplantation, and autoimmune conditions.   Hepatitis C blood testing is recommended for all people born from [birth year] through 1965 and any individual  with known risks for hepatitis C.   Practice safe sex. Use condoms and avoid high-risk sexual practices to reduce the spread of sexually transmitted infections (STIs). STIs include gonorrhea, chlamydia, syphilis, trichomonas, herpes, HPV, and human immunodeficiency virus (HIV). Herpes, HIV, and HPV are viral illnesses that have no cure. They can result in disability, cancer, and death. Sexually active women aged 25 years and younger should be checked for chlamydia. Older women with new or multiple partners should also be tested for chlamydia. Testing for other STIs is recommended if you are sexually active and at increased risk.   Osteoporosis is a disease in which the bones lose minerals and strength with aging. This can result in serious bone fractures or breaks. The risk of osteoporosis can be identified using a bone density scan. Women ages 65 years and over and women at risk for fractures or osteoporosis should discuss screening with their health care providers. Ask your health care provider whether you should take a calcium supplement or vitamin D to There are also several preventive steps women can take to avoid osteoporosis and resulting fractures or to keep osteoporosis from worsening. -->Recommendations include:  Eat a balanced diet high in fruits, vegetables, calcium, and vitamins.  Get enough calcium. The recommended total intake of is 1,200 mg daily; for best absorption, if taking supplements, divide doses into 250-500 mg doses throughout the day. Of the two types of calcium, calcium carbonate is best absorbed when taken with food but calcium citrate can be taken on an empty stomach.  Get enough vitamin D. NAMS and the National Osteoporosis Foundation recommend at least 1,000 IU per day for women age 50 and over who are at risk of vitamin D  deficiency. Vitamin D deficiency can be caused by inadequate sun exposure (for example, those who live in northern latitudes).  Avoid alcohol and smoking. Heavy alcohol intake (more than 7 drinks per week) increases the risk of falls and hip fracture and women smokers tend to lose bone more rapidly and have lower bone mass than nonsmokers. Stopping smoking is one of the most important changes women can make to improve their health and decrease risk for disease.  Be physically active every day. Weight-bearing exercise (for example, fast walking, hiking, jogging, and weight training) may strengthen bones or slow the rate of bone loss that comes with aging. Balancing and muscle-strengthening exercises can reduce the risk of falling and fracture.  Consider therapeutic medications. Currently, several types of effective drugs are available. Healthcare providers can recommend the type most appropriate for each woman.  Eliminate environmental factors that may contribute to accidents. Falls cause nearly 90% of all osteoporotic fractures, so reducing this risk is an important bone-health strategy. Measures include ample lighting, removing obstructions to walking, using nonskid rugs on floors, and placing mats and/or grab bars in showers.  Be aware of medication side effects. Some common medicines make bones weaker. These include a type of steroid drug called glucocorticoids used for arthritis and asthma, some antiseizure drugs, certain sleeping pills, treatments for endometriosis, and some cancer drugs. An overactive thyroid gland or using too much thyroid hormone for an underactive thyroid can also be a problem. If you are taking these medicines, talk to your doctor about what you can do to help protect your bones.reduce the rate of osteoporosis.    Menopause can be associated with physical symptoms and risks. Hormone replacement therapy is available to decrease symptoms and risks. You should talk to your  health care provider   about whether hormone replacement therapy is right for you.   Use sunscreen. Apply sunscreen liberally and repeatedly throughout the day. You should seek shade when your shadow is shorter than you. Protect yourself by wearing long sleeves, pants, a wide-brimmed hat, and sunglasses year round, whenever you are outdoors.   Once a month, do a whole body skin exam, using a mirror to look at the skin on your back. Tell your health care provider of new moles, moles that have irregular borders, moles that are larger than a pencil eraser, or moles that have changed in shape or color.   -Stay current with required vaccines (immunizations).   Influenza vaccine. All adults should be immunized every year.  Tetanus, diphtheria, and acellular pertussis (Td, Tdap) vaccine. Pregnant women should receive 1 dose of Tdap vaccine during each pregnancy. The dose should be obtained regardless of the length of time since the last dose. Immunization is preferred during the 27th 36th week of gestation. An adult who has not previously received Tdap or who does not know her vaccine status should receive 1 dose of Tdap. This initial dose should be followed by tetanus and diphtheria toxoids (Td) booster doses every 10 years. Adults with an unknown or incomplete history of completing a 3-dose immunization series with Td-containing vaccines should begin or complete a primary immunization series including a Tdap dose. Adults should receive a Td booster every 10 years.  Varicella vaccine. An adult without evidence of immunity to varicella should receive 2 doses or a second dose if she has previously received 1 dose. Pregnant females who do not have evidence of immunity should receive the first dose after pregnancy. This first dose should be obtained before leaving the health care facility. The second dose should be obtained 4 8 weeks after the first dose.  Human papillomavirus (HPV) vaccine. Females aged 13 26  years who have not received the vaccine previously should obtain the 3-dose series. The vaccine is not recommended for use in pregnant females. However, pregnancy testing is not needed before receiving a dose. If a female is found to be pregnant after receiving a dose, no treatment is needed. In that case, the remaining doses should be delayed until after the pregnancy. Immunization is recommended for any person with an immunocompromised condition through the age of 26 years if she did not get any or all doses earlier. During the 3-dose series, the second dose should be obtained 4 8 weeks after the first dose. The third dose should be obtained 24 weeks after the first dose and 16 weeks after the second dose.  Zoster vaccine. One dose is recommended for adults aged 60 years or older unless certain conditions are present.  Measles, mumps, and rubella (MMR) vaccine. Adults born before 1957 generally are considered immune to measles and mumps. Adults born in 1957 or later should have 1 or more doses of MMR vaccine unless there is a contraindication to the vaccine or there is laboratory evidence of immunity to each of the three diseases. A routine second dose of MMR vaccine should be obtained at least 28 days after the first dose for students attending postsecondary schools, health care workers, or international travelers. People who received inactivated measles vaccine or an unknown type of measles vaccine during 1963 1967 should receive 2 doses of MMR vaccine. People who received inactivated mumps vaccine or an unknown type of mumps vaccine before 1979 and are at high risk for mumps infection should consider immunization with 2 doses of   MMR vaccine. For females of childbearing age, rubella immunity should be determined. If there is no evidence of immunity, females who are not pregnant should be vaccinated. If there is no evidence of immunity, females who are pregnant should delay immunization until after pregnancy.  Unvaccinated health care workers born before [birth year] who lack laboratory evidence of measles, mumps, or rubella immunity or laboratory confirmation of disease should consider measles and mumps immunization with 2 doses of MMR vaccine or rubella immunization with 1 dose of MMR vaccine.  Pneumococcal 13-valent conjugate (PCV13) vaccine. When indicated, a person who is uncertain of her immunization history and has no record of immunization should receive the PCV13 vaccine. An adult aged 54 years or older who has certain medical conditions and has not been previously immunized should receive 1 dose of PCV13 vaccine. This PCV13 should be followed with a dose of pneumococcal polysaccharide (PPSV23) vaccine. The PPSV23 vaccine dose should be obtained at least 8 weeks after the dose of PCV13 vaccine. An adult aged 58 years or older who has certain medical conditions and previously received 1 or more doses of PPSV23 vaccine should receive 1 dose of PCV13. The PCV13 vaccine dose should be obtained 1 or more years after the last PPSV23 vaccine dose.  Pneumococcal polysaccharide (PPSV23) vaccine. When PCV13 is also indicated, PCV13 should be obtained first. All adults aged 58 years and older should be immunized. An adult younger than age 65 years who has certain medical conditions should be immunized. Any person who resides in a nursing home or long-term care facility should be immunized. An adult smoker should be immunized. People with an immunocompromised condition and certain other conditions should receive both PCV13 and PPSV23 vaccines. People with human immunodeficiency virus (HIV) infection should be immunized as soon as possible after diagnosis. Immunization during chemotherapy or radiation therapy should be avoided. Routine use of PPSV23 vaccine is not recommended for American Indians, [place] Natives, or people younger than 65 years unless there are medical conditions that require PPSV23 vaccine. When indicated,  people who have unknown immunization and have no record of immunization should receive PPSV23 vaccine. One-time revaccination 5 years after the first dose of PPSV23 is recommended for people aged 70 64 years who have chronic kidney failure, nephrotic syndrome, asplenia, or immunocompromised conditions. People who received 1 2 doses of PPSV23 before age 32 years should receive another dose of PPSV23 vaccine at age 96 years or later if at least 5 years have passed since the previous dose. Doses of PPSV23 are not needed for people immunized with PPSV23 at or after age 55 years.  Meningococcal vaccine. Adults with asplenia or persistent complement component deficiencies should receive 2 doses of quadrivalent meningococcal conjugate (MenACWY-D) vaccine. The doses should be obtained at least 2 months apart. Microbiologists working with certain meningococcal bacteria, [employer] recruits, people at risk during an outbreak, and people who travel to or live in countries with a high rate of meningitis should be immunized. A first-year college student up through age 58 years who is living in a residence hall should receive a dose if she did not receive a dose on or after her 16th birthday. Adults who have certain high-risk conditions should receive one or more doses of vaccine.  Hepatitis A vaccine. Adults who wish to be protected from this disease, have certain high-risk conditions, work with hepatitis A-infected animals, work in hepatitis A research labs, or travel to or work in countries with a high rate of hepatitis A should be  immunized. Adults who were previously unvaccinated and who anticipate close contact with an international adoptee during the first 60 days after arrival in the [place] from a country with a high rate of hepatitis A should be immunized.  Hepatitis B vaccine.  Adults who wish to be protected from this disease, have certain high-risk conditions, may be exposed to blood or other infectious  body fluids, are household contacts or sex partners of hepatitis B positive people, are clients or workers in certain care facilities, or travel to or work in countries with a high rate of hepatitis B should be immunized.  Haemophilus influenzae type b (Hib) vaccine. A previously unvaccinated person with asplenia or sickle cell disease or having a scheduled splenectomy should receive 1 dose of Hib vaccine. Regardless of previous immunization, a recipient of a hematopoietic stem cell transplant should receive a 3-dose series 6 12 months after her successful transplant. Hib vaccine is not recommended for adults with HIV infection.  Preventive Services / Frequency Ages 6 to 39years  Blood pressure check.** / Every 1 to 2 years.  Lipid and cholesterol check.** / Every 5 years beginning at age 39.  Clinical breast exam.** / Every 3 years for women in their 61s and 62s.  BRCA-related cancer risk assessment.** / For women who have family members with a BRCA-related cancer (breast, ovarian, tubal, or peritoneal cancers).  Pap test.** / Every 2 years from ages 47 through 85. Every 3 years starting at age 34 through age 12 or 74 with a history of 3 consecutive normal Pap tests.  HPV screening.** / Every 3 years from ages 46 through ages 43 to 54 with a history of 3 consecutive normal Pap tests.  Hepatitis C blood test.** / For any individual with known risks for hepatitis C.  Skin self-exam. / Monthly.  Influenza vaccine. / Every year.  Tetanus, diphtheria, and acellular pertussis (Tdap, Td) vaccine.** / Consult your health care provider. Pregnant women should receive 1 dose of Tdap vaccine during each pregnancy. 1 dose of Td every 10 years.  Varicella vaccine.** / Consult your health care provider. Pregnant females who do not have evidence of immunity should receive the first dose after pregnancy.  HPV vaccine. / 3 doses over 6 months, if 64 and younger. The vaccine is not recommended for use in  pregnant females. However, pregnancy testing is not needed before receiving a dose.  Measles, mumps, rubella (MMR) vaccine.** / You need at least 1 dose of MMR if you were born in 1957 or later. You may also need a 2nd dose. For females of childbearing age, rubella immunity should be determined. If there is no evidence of immunity, females who are not pregnant should be vaccinated. If there is no evidence of immunity, females who are pregnant should delay immunization until after pregnancy.  Pneumococcal 13-valent conjugate (PCV13) vaccine.** / Consult your health care provider.  Pneumococcal polysaccharide (PPSV23) vaccine.** / 1 to 2 doses if you smoke cigarettes or if you have certain conditions.  Meningococcal vaccine.** / 1 dose if you are age 71 to 35 years and a Market researcher living in a residence hall, or have one of several medical conditions, you need to get vaccinated against meningococcal disease. You may also need additional booster doses.  Hepatitis A vaccine.** / Consult your health care provider.  Hepatitis B vaccine.** / Consult your health care provider.  Haemophilus influenzae type b (Hib) vaccine.** / Consult your health care provider.  Ages 55 to 64years  Blood pressure check.** / Every 1 to 2 years.  Lipid and cholesterol check.** / Every 5 years beginning at age 20 years.  Lung cancer screening. / Every year if you are aged 55 80 years and have a 30-pack-year history of smoking and currently smoke or have quit within the past 15 years. Yearly screening is stopped once you have quit smoking for at least 15 years or develop a health problem that would prevent you from having lung cancer treatment.  Clinical breast exam.** / Every year after age 40 years.  BRCA-related cancer risk assessment.** / For women who have family members with a BRCA-related cancer (breast, ovarian, tubal, or peritoneal cancers).  Mammogram.** / Every year beginning at age 40  years and continuing for as long as you are in good health. Consult with your health care provider.  Pap test.** / Every 3 years starting at age 30 years through age 65 or 70 years with a history of 3 consecutive normal Pap tests.  HPV screening.** / Every 3 years from ages 30 years through ages 65 to 70 years with a history of 3 consecutive normal Pap tests.  Fecal occult blood test (FOBT) of stool. / Every year beginning at age 50 years and continuing until age 75 years. You may not need to do this test if you get a colonoscopy every 10 years.  Flexible sigmoidoscopy or colonoscopy.** / Every 5 years for a flexible sigmoidoscopy or every 10 years for a colonoscopy beginning at age 50 years and continuing until age 75 years.  Hepatitis C blood test.** / For all people born from 1945 through 1965 and any individual with known risks for hepatitis C.  Skin self-exam. / Monthly.  Influenza vaccine. / Every year.  Tetanus, diphtheria, and acellular pertussis (Tdap/Td) vaccine.** / Consult your health care provider. Pregnant women should receive 1 dose of Tdap vaccine during each pregnancy. 1 dose of Td every 10 years.  Varicella vaccine.** / Consult your health care provider. Pregnant females who do not have evidence of immunity should receive the first dose after pregnancy.  Zoster vaccine.** / 1 dose for adults aged 60 years or older.  Measles, mumps, rubella (MMR) vaccine.** / You need at least 1 dose of MMR if you were born in 1957 or later. You may also need a 2nd dose. For females of childbearing age, rubella immunity should be determined. If there is no evidence of immunity, females who are not pregnant should be vaccinated. If there is no evidence of immunity, females who are pregnant should delay immunization until after pregnancy.  Pneumococcal 13-valent conjugate (PCV13) vaccine.** / Consult your health care provider.  Pneumococcal polysaccharide (PPSV23) vaccine.** / 1 to 2 doses if  you smoke cigarettes or if you have certain conditions.  Meningococcal vaccine.** / Consult your health care provider.  Hepatitis A vaccine.** / Consult your health care provider.  Hepatitis B vaccine.** / Consult your health care provider.  Haemophilus influenzae type b (Hib) vaccine.** / Consult your health care provider.  Ages 65 years and over  Blood pressure check.** / Every 1 to 2 years.  Lipid and cholesterol check.** / Every 5 years beginning at age 20 years.  Lung cancer screening. / Every year if you are aged 55 80 years and have a 30-pack-year history of smoking and currently smoke or have quit within the past 15 years. Yearly screening is stopped once you have quit smoking for at least 15 years or develop a health problem that   would prevent you from having lung cancer treatment.  Clinical breast exam.** / Every year after age 103 years.  BRCA-related cancer risk assessment.** / For women who have family members with a BRCA-related cancer (breast, ovarian, tubal, or peritoneal cancers).  Mammogram.** / Every year beginning at age 36 years and continuing for as long as you are in good health. Consult with your health care provider.  Pap test.** / Every 3 years starting at age 5 years through age 85 or 10 years with 3 consecutive normal Pap tests. Testing can be stopped between 65 and 70 years with 3 consecutive normal Pap tests and no abnormal Pap or HPV tests in the past 10 years.  HPV screening.** / Every 3 years from ages 93 years through ages 70 or 45 years with a history of 3 consecutive normal Pap tests. Testing can be stopped between 65 and 70 years with 3 consecutive normal Pap tests and no abnormal Pap or HPV tests in the past 10 years.  Fecal occult blood test (FOBT) of stool. / Every year beginning at age 8 years and continuing until age 45 years. You may not need to do this test if you get a colonoscopy every 10 years.  Flexible sigmoidoscopy or colonoscopy.** /  Every 5 years for a flexible sigmoidoscopy or every 10 years for a colonoscopy beginning at age 69 years and continuing until age 68 years.  Hepatitis C blood test.** / For all people born from [birth year] through 1967 and any individual with known risks for hepatitis C.  Osteoporosis screening.** / A one-time screening for women ages 7 years and over and women at risk for fractures or osteoporosis.  Skin self-exam. / Monthly.  Influenza vaccine. / Every year.  Tetanus, diphtheria, and acellular pertussis (Tdap/Td) vaccine.** / 1 dose of Td every 10 years.  Varicella vaccine.** / Consult your health care provider.  Zoster vaccine.** / 1 dose for adults aged 5 years or older.  Pneumococcal 13-valent conjugate (PCV13) vaccine.** / Consult your health care provider.  Pneumococcal polysaccharide (PPSV23) vaccine.** / 1 dose for all adults aged 74 years and older.  Meningococcal vaccine.** / Consult your health care provider.  Hepatitis A vaccine.** / Consult your health care provider.  Hepatitis B vaccine.** / Consult your health care provider.  Haemophilus influenzae type b (Hib) vaccine.** / Consult your health care provider. ** Family history and personal history of risk and conditions may change your health care provider's recommendations. Document Released: 10/21/2001 Document Revised: 06/15/2013  [name] Patient Information 2014 [place], [place].   EXERCISE AND DIET:  We recommended that you start or continue a regular exercise program for good health. Regular exercise means any activity that makes your heart beat faster and makes you sweat.  We recommend exercising at least 30 minutes per day at least 3 days a week, preferably 5.  We also recommend a diet low in fat and sugar / carbohydrates.  Inactivity, poor dietary choices and obesity can cause diabetes, heart attack, stroke, and kidney damage, among others.     ALCOHOL AND SMOKING:  Women should limit their alcohol intake to no  more than 7 drinks/beers/glasses of wine (combined, not each!) per week. Moderation of alcohol intake to this level decreases your risk of breast cancer and liver damage.  ( And of course, no recreational drugs are part of a healthy lifestyle.)  Also, you should not be smoking at all or even being exposed to second hand smoke. Most people know smoking can  cause cancer, and various heart and lung diseases, but did you know it also contributes to weakening of your bones?  Aging of your skin?  Yellowing of your teeth and nails?   CALCIUM AND VITAMIN D:  Adequate intake of calcium and Vitamin D are recommended.  The recommendations for exact amounts of these supplements seem to change often, but generally speaking 600 mg of calcium (either carbonate or citrate) and 800 units of Vitamin D per day seems prudent. Certain women may benefit from higher intake of Vitamin D.  If you are among these women, your doctor will have told you during your visit.     PAP SMEARS:  Pap smears, to check for cervical cancer or precancers,  have traditionally been done yearly, although recent scientific advances have shown that most women can have pap smears less often.  However, every woman still should have a physical exam from her gynecologist or primary care physician every year. It will include a breast check, inspection of the vulva and vagina to check for abnormal growths or skin changes, a visual exam of the cervix, and then an exam to evaluate the size and shape of the uterus and ovaries.  And after 49 years of age, a rectal exam is indicated to check for rectal cancers. We will also provide age appropriate advice regarding health maintenance, like when you should have certain vaccines, screening for sexually transmitted diseases, bone density testing, colonoscopy, mammograms, etc.    MAMMOGRAMS:  All women over 71 years old should have a yearly mammogram. Many facilities now offer a "3D" mammogram, which may cost  around $50 extra out of pocket. If possible,  we recommend you accept the option to have the 3D mammogram performed.  It both reduces the number of women who will be called back for extra views which then turn out to be normal, and it is better than the routine mammogram at detecting truly abnormal areas.     COLONOSCOPY:  Colonoscopy to screen for colon cancer is recommended for all women at age 52.  We know, you hate the idea of the prep.  We agree, BUT, having colon cancer and not knowing it is worse!!  Colon cancer so often starts as a polyp that can be seen and removed at colonscopy, which can quite literally save your life!  And if your first colonoscopy is normal and you have no family history of colon cancer, most women don't have to have it again for 10 years.  Once every ten years, you can do something that may end up saving your life, right?  We will be happy to help you get it scheduled when you are ready.  Be sure to check your insurance coverage so you understand how much it will cost.  It may be covered as a preventative service at no cost, but you should check your particular policy.

## 2018-07-19 NOTE — Progress Notes (Signed)
Impression and Recommendations:    1. Encounter for general adult medical examination with abnormal findings   2. Health education/counseling   3. Encounter for screening for infections with a predominantly sexual mode of transmission   4. Screening examination for infectious disease   5. Nodule of neck   6. Crohn's disease of large intestine with complication (HCC)     1) Anticipatory Guidance: Discussed importance of wearing a seatbelt while driving, not texting while driving; sunscreen when outside along with yearly skin surveillance; eating a well balanced and modest diet; physical activity at least 25 minutes per day or 150 min/ week of moderate to intense activity.  - Reviewed skin screening habits and techniques in office today.  Discussed importance of looking for changes in size, shape, color.  2) Immunizations / Screenings / Labs:  All immunizations and screenings that patient agrees to, are up-to-date per recommendations or will be updated today.  Patient understands the needs for q 52mo dental and yearly vision screens which pt will schedule independently. Obtain CBC, CMP, HgA1c, Lipid panel, TSH and vit D when fasting if not already done recently.   - Advised patient to continue to follow up with GI as recommended.  Per patient, with Crohn's, obtains colonoscopies every 3 years.  She knows to call and schedule next. - Encouraged patient to follow up with Dr. Leone Payor regarding her Crohn's and address related concerns to him.  Advised patient to ask Dr. Leone Payor about need for stool cards between years of colonoscopy.  Nodule R side of neck - Pt with freely mobile small nodule in the soft tissue lateral to right lobe of thyroid. - Recommended ultrasound today.   Female Screenings: - Continue to follow up with OBGYN as recommended. - Obtain next mammogram per guidelines.  Patient knows to call and schedule.  Possible exposure to dx of sexual nature: - Advised patient to  obtain sexual screening & testing with every new sexual concern, any new partners, and every new unprotected sexual exposure.  Advised patient to have conversations with partner about sexual health and sexual health screenings.  Skin health:  - Use sunscreen - Recommended that patient begin yearly follow-up with dermatology.   - Reviewed need for shingles vaccine at age 61.   3) Smoking Cessation: - Patient currently smokes [employer].  Told pt to think seriously about quitting smoking!  Told pt it is very important for his/her health and well being.    - Smoking cessation instruction/counseling given:  counseled patient on the dangers of tobacco use, advised patient to stop smoking, and reviewed strategies to maximize success.  - Discussed with patient that there are multiple treatments to aid in quitting smoking, however I explained none will work unless pt really want to quit.  - Told to call 1-800-QUIT-NOW 786-088-0346) for free smoking cessation counseling and support, or pt can go online to www.heart.org - the American Heart Association website and search "quit smoking."   4) Weight:   Discussed goal of physical conditioning to improve overall feelings of well being and improve objective health data.   Improve nutrient density of diet through increasing intake of fruits and vegetables and decreasing saturated/trans fats, white flour products and refined sugar products.    5) BMI Counseling Explained to patient what BMI refers to, and what it means medically.    Told patient to think about it as a "medical risk stratification measurement" and how increasing BMI is associated with increasing risk/ or worsening  state of various diseases such as hypertension, hyperlipidemia, diabetes, premature OA, depression etc.  American Heart Association guidelines for healthy diet, basically Mediterranean diet, and exercise guidelines of 30 minutes 5 days per week or more discussed in  detail.  Health counseling performed.  All questions answered.   6) Lifestyle & Preventative Health Maintenance - Advised patient to continue working toward exercising to improve overall mental, physical, and emotional health.    - Encouraged patient to engage in daily physical activity, especially a formal exercise routine.  Recommended that the patient eventually strive for at least 150 minutes of moderate cardiovascular activity per week according to guidelines established by the [facility].   - Healthy dietary habits encouraged, including low-carb, and high amounts of lean protein in diet.   - Patient should also consume adequate amounts of water.  7) Follow-Up - Prescriptions refilled today PRN. - Fasting lab work drawn today as recommended. - Otherwise, continue to return for CPE and chronic follow-up as scheduled.   - Patient knows to call in sooner if desired to address acute concerns.    No orders of the defined types were placed in this encounter.   Orders Placed This Encounter  Procedures  . Chlamydia/Gonococcus/Trichomonas, NAA  . US SOFT TISSUE HEAD & NECK (NON-THYROID)  . CBC with Differential/Platelet  . Comprehensive metabolic panel  . Hemoglobin A1c  . Lipid panel  . T4, free  . TSH  . VITAMIN D 25 Hydroxy (Vit-D Deficiency, Fractures)  . HIV antibody (with reflex)  . Hepatitis C Antibody  . RPR  . HSV Type I/II IgG, IgMw/ reflex    Gross side effects, risk and benefits, and alternatives of medications discussed with patient.  Patient is aware that all medications have potential side effects and we are unable to predict every side effect or drug-drug interaction that may occur.  Expresses verbal understanding and consents to current therapy plan and treatment regimen.  F-up preventative CPE in 1 year. F/up sooner for chronic care management as discussed and/or prn.  Please see orders placed and AVS handed out to patient at the end of our visit for further  patient instructions/ counseling done pertaining to today's office visit.  This document serves as a record of services personally performed by [name], DO. It was created on her behalf by Peggye Fothergill, a trained medical scribe. The creation of this record is based on the scribe's personal observations and the provider's statements to them.   I have reviewed the above medical documentation for accuracy and completeness and I concur.  [name], DO, D.O. 07/19/2018 11:53 AM       Subjective:    Chief Complaint  Patient presents with  . Annual Exam    HPI: Sydney [name] is a 47 y.o. female who presents to [facility] today a yearly health maintenance exam.  Health Maintenance Summary Reviewed and updated, unless pt declines services.  Colonoscopy:  Patient with Crohn's; obtains colonoscopies every 3 years.  Sees Dr. Leone Payor. Great grandmother had colon cancer.  Patient thinks she had Crohn's and diagnosed with colon cancer in her 80's or 55's.  States that the Crohn's runs in her family.  Notes her mother and grandmother have Crohn's. Tobacco History Reviewed:  Yes; current tobacco smoker. Quit smoking cigarettes about 20 years ago, but continues smoking [employer]. CT scan for screening lung CA:  Need for screening at age 57. Alcohol:  No concerns, no excessive use. Exercise  Habits:  Not meeting AHA guidelines. STD concerns:  Patient has had a new sexual partner, with no testing since. Drug Use:  Smokes marijuana every day for assistance with Crohn's. Birth control method:  No concerns. Menses regular:  No concerns. Lumps or breast concerns:  No concerns. Breast Cancer Family History:  Her dad's mother had breast cancer (paternal grandmother), and her mother's aunt had breast cancer (maternal great aunt). Bone/ DEXA scan:  Patient is age 75, unnecessary due to < 27.  OBGYN Health Follows up with OBGYN; has had a  hysterectomy. Patient's last pap smear was normal.  Due for mammogram in near future.  Obtains them every two years.  Visual Health States that "it's been awhile" since her last eye visit.  Dental Health Patient hasn't been to the dentist in a year.  Dermatological Health Patient does not follow up with dermatology yearly. Last visited [facility] in the past.   Health Maintenance  Topic Date Due  . TETANUS/TDAP  04/23/2019 (Originally 11/09/1987)  . HIV Screening  04/23/2019 (Originally 11/09/1983)  . INFLUENZA VACCINE  07/15/2019 (Originally 04/08/2018)  . PAP SMEAR  07/09/2019     Wt Readings from Last 3 Encounters:  07/19/18 140 lb (63.5 kg)  05/06/18 138 lb (62.6 kg)  04/27/18 138 lb (62.6 kg)   BP Readings from Last 3 Encounters:  07/19/18 114/77  05/06/18 130/86  04/27/18 115/69   Pulse Readings from Last 3 Encounters:  07/19/18 71  05/06/18 76  04/27/18 82     Past Medical History:  Diagnosis Date  . ADD (attention deficit disorder)   . Crohn disease (HCC)   . Crohn's colitis (HCC)   . Depression with anxiety   . GERD (gastroesophageal reflux disease)   . History of iron deficiency anemia   . Nephrolithiasis       Past Surgical History:  Procedure Laterality Date  . ABDOMINAL HYSTERECTOMY  2011  . CESAREAN SECTION     x 3  . CHOLECYSTECTOMY    . COLONOSCOPY W/ BIOPSIES AND POLYPECTOMY  12/24/2009   pseudopolyps (hyperplastic), crohn's   . TONSILLECTOMY  1974  . UPPER GASTROINTESTINAL ENDOSCOPY  12/24/2009   w/bx, gastritis      Family History  Problem Relation Age of Onset  . Crohn's disease Mother   . Crohn's disease Unknown        grandmother  . Colon cancer Unknown        great grandmother      Social History   Substance and Sexual Activity  Drug Use Never  ,   Social History   Substance and Sexual Activity  Alcohol Use Yes   Comment: occ.  ,   Social History   Tobacco Use  Smoking Status Current Every Day  Smoker  . Types: Cigars  Smokeless Tobacco Never Used  Tobacco Comment   2 small cigars a day  ,   Social History   Substance and Sexual Activity  Sexual Activity Yes  . Birth control/protection: None    Current Outpatient Medications on File Prior to Visit  Medication Sig Dispense Refill  . FLUoxetine (PROZAC) 40 MG capsule Take 1 capsule by mouth daily.     No current facility-administered medications on file prior to visit.     Allergies: Metronidazole and Penicillins  Review of Systems: General:   Denies fever, chills, unexplained weight loss.  Optho/Auditory:   Denies visual changes, blurred vision/LOV Respiratory:   Denies SOB, DOE more than baseline levels.  Cardiovascular:   Denies chest pain, palpitations, new onset peripheral edema  Gastrointestinal:   Denies nausea, vomiting, diarrhea.  Genitourinary: Denies dysuria, freq/ urgency, flank pain or discharge from genitals.  Endocrine:     Denies hot or cold intolerance, polyuria, polydipsia. Musculoskeletal:   Denies unexplained myalgias, joint swelling, unexplained arthralgias, gait problems.  Skin:  Denies rash, suspicious lesions Neurological:     Denies dizziness, unexplained weakness, numbness  Psychiatric/Behavioral:   Denies mood changes, suicidal or homicidal ideations, hallucinations    Objective:    Blood pressure 114/77, pulse 71, temperature 98.6 F (37 C), height 5\' 3"  (1.6 m), weight 140 lb (63.5 kg), SpO2 99 %. Body mass index is 24.8 kg/m. General Appearance:    Alert, cooperative, no distress, appears stated age  Head:    Normocephalic, without obvious abnormality, atraumatic  Eyes:    PERRL, conjunctiva/corneas clear, EOM's intact, fundi    benign, both eyes  Ears:    Normal TM's and external ear canals, both ears  Nose:   Nares normal, septum midline, mucosa normal, no drainage    or sinus tenderness  Throat:   Lips w/o lesion, mucosa moist, and tongue normal; teeth and   gums normal   Neck:   Supple, symmetrical, trachea midline, no adenopathy;  thyroid:  no enlargement/tenderness, freely mobile small nodule in the soft tissue lateral to right lobe of thyroid; no carotid bruit or JVD  Back:     Symmetric, no curvature, ROM normal, no CVA tenderness  Lungs:     Clear to auscultation bilaterally, respirations unlabored, no       Wh/ R/ R  Chest Wall:    No tenderness or gross deformity; normal excursion   Heart:    Regular rate and rhythm, S1 and S2 normal, no murmur, rub   or gallop  Breast Exam:   Deferred to OBGYN.  Abdomen:     Soft, non-tender, bowel sounds active all four quadrants, NO   G/R/R, no masses, no organomegaly  Genitalia:   Deferred to OBGYN.  Rectal:   Deferred to OBGYN.  Extremities:   Extremities normal, atraumatic, no cyanosis or gross edema  Pulses:   2+ and symmetric all extremities  Skin:   Warm, dry, Skin color, texture, turgor normal, no obvious rashes or lesions Psych: No HI/SI, judgement and insight good, Euthymic mood. Full Affect.  Neurologic:   CNII-XII intact, normal strength, sensation and reflexes    Throughout

## 2018-07-20 LAB — CBC WITH DIFFERENTIAL/PLATELET
Basophils Absolute: 0.1 10*3/uL (ref 0.0–0.2)
Basos: 1 %
EOS (ABSOLUTE): 0.1 10*3/uL (ref 0.0–0.4)
Eos: 2 %
Hematocrit: 40.2 % (ref 34.0–46.6)
Hemoglobin: 13.9 g/dL (ref 11.1–15.9)
IMMATURE GRANULOCYTES: 0 %
Immature Grans (Abs): 0 10*3/uL (ref 0.0–0.1)
LYMPHS ABS: 1.7 10*3/uL (ref 0.7–3.1)
Lymphs: 22 %
MCH: 33.3 pg — AB (ref 26.6–33.0)
MCHC: 34.6 g/dL (ref 31.5–35.7)
MCV: 96 fL (ref 79–97)
MONOS ABS: 0.4 10*3/uL (ref 0.1–0.9)
Monocytes: 5 %
NEUTROS PCT: 70 %
Neutrophils Absolute: 5.5 10*3/uL (ref 1.4–7.0)
PLATELETS: 276 10*3/uL (ref 150–450)
RBC: 4.18 x10E6/uL (ref 3.77–5.28)
RDW: 11.6 % — AB (ref 12.3–15.4)
WBC: 7.7 10*3/uL (ref 3.4–10.8)

## 2018-07-20 LAB — LIPID PANEL
CHOLESTEROL TOTAL: 189 mg/dL (ref 100–199)
Chol/HDL Ratio: 2.8 ratio (ref 0.0–4.4)
HDL: 67 mg/dL (ref >39)
LDL Calculated: 105 mg/dL — ABNORMAL HIGH (ref 0–99)
Triglycerides: 87 mg/dL (ref 0–149)
VLDL Cholesterol Cal: 17 mg/dL (ref 5–40)

## 2018-07-20 LAB — COMPREHENSIVE METABOLIC PANEL
A/G RATIO: 1.6 (ref 1.2–2.2)
ALK PHOS: 109 IU/L (ref 39–117)
ALT: 10 IU/L (ref 0–32)
AST: 14 IU/L (ref 0–40)
Albumin: 4.2 g/dL (ref 3.5–5.5)
BILIRUBIN TOTAL: 1 mg/dL (ref 0.0–1.2)
BUN/Creatinine Ratio: 15 (ref 9–23)
BUN: 15 mg/dL (ref 6–24)
CALCIUM: 9.5 mg/dL (ref 8.7–10.2)
CHLORIDE: 102 mmol/L (ref 96–106)
CO2: 23 mmol/L (ref 20–29)
Creatinine, Ser: 0.99 mg/dL (ref 0.57–1.00)
GFR calc Af Amer: 77 mL/min/{1.73_m2} (ref >59)
GFR calc non Af Amer: 67 mL/min/{1.73_m2} (ref >59)
Globulin, Total: 2.6 g/dL (ref 1.5–4.5)
Glucose: 92 mg/dL (ref 65–99)
POTASSIUM: 4.3 mmol/L (ref 3.5–5.2)
SODIUM: 142 mmol/L (ref 134–144)
Total Protein: 6.8 g/dL (ref 6.0–8.5)

## 2018-07-20 LAB — HEPATITIS C ANTIBODY: HEP C VIRUS AB: 0.2 {s_co_ratio} (ref 0.0–0.9)

## 2018-07-20 LAB — VITAMIN D 25 HYDROXY (VIT D DEFICIENCY, FRACTURES): VIT D 25 HYDROXY: 32.5 ng/mL (ref 30.0–100.0)

## 2018-07-20 LAB — HEMOGLOBIN A1C
ESTIMATED AVERAGE GLUCOSE: 100 mg/dL
HEMOGLOBIN A1C: 5.1 % (ref 4.8–5.6)

## 2018-07-20 LAB — HSV TYPE I/II IGG, IGMW/ REFLEX
HSV 1 Glycoprotein G Ab, IgG: 35.9 index — ABNORMAL HIGH (ref 0.00–0.90)
HSV 1 IgM: <1:10 {titer}
HSV 2 IgG, Type Spec: <0.91 index (ref 0.00–0.90)
HSV 2 IgM: <1:10 {titer}

## 2018-07-20 LAB — T4, FREE: FREE T4: 1.01 ng/dL (ref 0.82–1.77)

## 2018-07-20 LAB — RPR: RPR Ser Ql: NONREACTIVE

## 2018-07-20 LAB — TSH: TSH: 1.69 u[IU]/mL (ref 0.450–4.500)

## 2018-07-20 LAB — HIV ANTIBODY (ROUTINE TESTING W REFLEX): HIV Screen 4th Generation wRfx: NONREACTIVE

## 2018-07-21 ENCOUNTER — Other Ambulatory Visit: Payer: 59

## 2018-07-21 LAB — CHLAMYDIA/GONOCOCCUS/TRICHOMONAS, NAA
Chlamydia by NAA: NEGATIVE
GONOCOCCUS BY NAA: NEGATIVE
TRICH VAG BY NAA: NEGATIVE

## 2018-07-26 ENCOUNTER — Other Ambulatory Visit: Payer: 59

## 2018-07-29 ENCOUNTER — Ambulatory Visit
Admission: RE | Admit: 2018-07-29 | Discharge: 2018-07-29 | Disposition: A | Discharge status: Home / Self Care | Payer: 59 | Source: Ambulatory Visit | Attending: Family Medicine | Admitting: Family Medicine

## 2018-07-29 DIAGNOSIS — R221 Localized swelling, mass and lump, neck: Secondary | ICD-10-CM

## 2018-07-29 DIAGNOSIS — K50119 Crohn's disease of large intestine with unspecified complications: Secondary | ICD-10-CM

## 2018-07-29 DIAGNOSIS — Z0001 Encounter for general adult medical examination with abnormal findings: Secondary | ICD-10-CM

## 2018-07-29 DIAGNOSIS — E041 Nontoxic single thyroid nodule: Secondary | ICD-10-CM | POA: Diagnosis not present

## 2018-09-02 ENCOUNTER — Ambulatory Visit (INDEPENDENT_AMBULATORY_CARE_PROVIDER_SITE_OTHER): Payer: 59 | Admitting: Adult Health

## 2018-09-02 ENCOUNTER — Encounter: Payer: Self-pay | Admitting: Adult Health

## 2018-09-02 VITALS — BP 106/69 | HR 89 | Temp 102.6°F | Ht 63.0 in | Wt 133.6 lb

## 2018-09-02 DIAGNOSIS — R109 Unspecified abdominal pain: Secondary | ICD-10-CM

## 2018-09-02 DIAGNOSIS — R8290 Unspecified abnormal findings in urine: Secondary | ICD-10-CM | POA: Diagnosis not present

## 2018-09-02 DIAGNOSIS — R509 Fever, unspecified: Secondary | ICD-10-CM | POA: Insufficient documentation

## 2018-09-02 DIAGNOSIS — R6889 Other general symptoms and signs: Secondary | ICD-10-CM | POA: Diagnosis not present

## 2018-09-02 LAB — POCT URINALYSIS DIPSTICK
Bilirubin, UA: NEGATIVE
Glucose, UA: NEGATIVE
Ketones, UA: 15
NITRITE UA: POSITIVE
PROTEIN UA: POSITIVE — AB
Spec Grav, UA: 1.015 (ref 1.010–1.025)
Urobilinogen, UA: 0.2 E.U./dL
pH, UA: 6 (ref 5.0–8.0)

## 2018-09-02 LAB — POCT INFLUENZA A/B
Influenza A, POC: NEGATIVE
Influenza B, POC: NEGATIVE

## 2018-09-02 MED ORDER — ONDANSETRON HCL 4 MG PO TABS: 4.0000 mg | ORAL_TABLET | Freq: Three times a day (TID) | ORAL | 0 refills | Status: DC | PRN

## 2018-09-02 MED ORDER — CIPROFLOXACIN HCL 500 MG PO TABS: 500.0000 mg | ORAL_TABLET | Freq: Two times a day (BID) | ORAL | 0 refills | Status: DC

## 2018-09-02 NOTE — Assessment & Plan Note (Addendum)
UA: Blood-large Nitrates- Positive Leu- Small Bil Flank Pain Urinary frequency  Please take Ciprofloxacin as directed. Please take Ondansetron as needed. Sip on fluids and follow BRAT diet. We will call you when urine culture/sensitivty results are available.

## 2018-09-02 NOTE — Progress Notes (Signed)
Subjective:    Patient ID: Sydney [name], female    DOB: 09/28/1970, 47 y.o.   MRN: [id number]  HPI:  Sydney [name] presents with sudden onset of body aches (esp. Flank pain), fever,fatigue,non-productive cough, frontal HA (8/10), urinary frequency, nausea/vomiting (last emesis 0700 this morning- all sx's started approx 48 hrs ago. She has been alternating OTC Acetaminophen and Ibuprofen, last doses were yesterday, current temp 102.6 f oral She denies abdominal pain/hematuria/hematochezia She reports her daughter had sinusitis last week She denies CP/dyspnea/dizziness/palpitations  Patient Care Team    Relationship Specialty Notifications Start End  Mellody Dance, DO PCP - General Family Medicine  04/22/18   Bobbye Charleston, MD Consulting Physician Obstetrics and Gynecology  04/22/18   Gatha Mayer, MD Consulting Physician Gastroenterology  07/19/18    Comment: scopes/ Chrohn's    Patient Active Problem List   Diagnosis Date Noted  . Flu-like symptoms 09/02/2018  . Abnormal urinalysis 09/02/2018  . Flank pain 09/02/2018  . Fever 09/02/2018  . Nodule of neck 07/19/2018  . Smoker 04/22/2018  . Tobacco abuse counseling 04/22/2018  . Plantar wart of left foot 04/22/2018  . Crohn's disease of large intestine with complication ([place]) 28/76/8115  . ANXIETY DEPRESSION 12/17/2009  . DYSPHAGIA 12/17/2009  . GERD 01/21/2008  . HIATAL HERNIA 01/21/2008     Past Medical History:  Diagnosis Date  . ADD (attention deficit disorder)   . Crohn disease ([place])   . Crohn's colitis ([place])   . Depression with anxiety   . GERD (gastroesophageal reflux disease)   . History of iron deficiency anemia   . Nephrolithiasis      Past Surgical History:  Procedure Laterality Date  . ABDOMINAL HYSTERECTOMY  2011  . CESAREAN SECTION     x 3  . CHOLECYSTECTOMY    . COLONOSCOPY W/ BIOPSIES AND POLYPECTOMY  12/24/2009   pseudopolyps (hyperplastic), crohn's   . TONSILLECTOMY  1974  . UPPER  GASTROINTESTINAL ENDOSCOPY  12/24/2009   w/bx, gastritis     Family History  Problem Relation Age of Onset  . Crohn's disease Mother   . Crohn's disease Unknown        grandmother  . Colon cancer Unknown        great grandmother     Social History   Substance and Sexual Activity  Drug Use Never     Social History   Substance and Sexual Activity  Alcohol Use Yes   Comment: occ.     Social History   Tobacco Use  Smoking Status Current Every Day Smoker  . Types: Cigars  Smokeless Tobacco Never Used  Tobacco Comment   2 small cigars a day     Outpatient Encounter Medications as of 09/02/2018  Medication Sig  . FLUoxetine (PROZAC) 40 MG capsule Take 1 capsule by mouth daily.  . ciprofloxacin (CIPRO) 500 MG tablet Take 1 tablet (500 mg total) by mouth 2 (two) times daily.  . ondansetron (ZOFRAN) 4 MG tablet Take 1 tablet (4 mg total) by mouth every 8 (eight) hours as needed for nausea or vomiting.   No facility-administered encounter medications on file as of 09/02/2018.     Allergies: Metronidazole and Penicillins  Body mass index is 23.67 kg/m.  Blood pressure 106/69, pulse 89, temperature (!) 102.6 F (39.2 C), temperature source Oral, height 5' 3"  (1.6 m), weight 133 lb 9.6 oz (60.6 kg), SpO2 99 %.  Review of Systems  Constitutional: Positive for activity change, appetite change, chills, fatigue  and fever. Negative for diaphoresis and unexpected weight change.  HENT: Negative for congestion, postnasal drip, rhinorrhea, sinus pressure, sore throat and voice change.   Respiratory: Positive for cough. Negative for shortness of breath, wheezing and stridor.   Cardiovascular: Negative for chest pain, palpitations and leg swelling.  Gastrointestinal: Positive for nausea and vomiting. Negative for abdominal distention, abdominal pain, blood in stool, constipation and diarrhea.  Genitourinary: Positive for difficulty urinating, dysuria, flank pain and frequency.   Neurological: Positive for headaches. Negative for dizziness.  Hematological: Does not bruise/bleed easily.  Psychiatric/Behavioral: Positive for sleep disturbance.       Objective:   Physical Exam Vitals signs and nursing note reviewed.  Constitutional:      General: She is in acute distress.     Appearance: She is ill-appearing.  HENT:     Head: Normocephalic and atraumatic.     Right Ear: There is no impacted cerumen.     Left Ear: There is no impacted cerumen.     Nose: Nose normal. No congestion or rhinorrhea.     Mouth/Throat:     Mouth: Mucous membranes are moist.     Pharynx: No posterior oropharyngeal erythema.  Eyes:     Extraocular Movements: Extraocular movements intact.     Pupils: Pupils are equal, round, and reactive to light.  Neck:     Musculoskeletal: Normal range of motion and neck supple.  Cardiovascular:     Rate and Rhythm: Normal rate.     Heart sounds: No murmur. No gallop.   Pulmonary:     Effort: Pulmonary effort is normal. No respiratory distress.     Breath sounds: Normal breath sounds. No stridor. No wheezing, rhonchi or rales.  Chest:     Chest wall: No tenderness.  Abdominal:     General: Abdomen is flat. Bowel sounds are normal.     Tenderness: There is no abdominal tenderness. There is no right CVA tenderness.  Skin:    General: Skin is warm.     Capillary Refill: Capillary refill takes less than 2 seconds.  Neurological:     Mental Status: She is oriented to person, place, and time.  Psychiatric:        Mood and Affect: Mood normal.        Behavior: Behavior normal.        Thought Content: Thought content normal.        Judgment: Judgment normal.           Assessment & Plan:   1. Flu-like symptoms   2. Abnormal urinalysis   3. Flank pain   4. Fever, unspecified fever cause     Flu-like symptoms Flu test Negative  Flank pain UA: Blood-large Nitrates- Positive Leu- Small Bil Flank Pain Urinary frequency  Please  take Ciprofloxacin as directed. Please take Ondansetron as needed. Sip on fluids and follow BRAT diet. We will call you when urine culture/sensitivty results are available.    FOLLOW-UP:  Return if symptoms worsen or fail to improve.

## 2018-09-02 NOTE — Assessment & Plan Note (Signed)
Flu test Negative

## 2018-09-02 NOTE — Patient Instructions (Signed)
Urinary Tract Infection, Adult A urinary tract infection (UTI) is an infection of any part of the urinary tract. The urinary tract includes:  The kidneys.  The ureters.  The bladder.  The urethra. These organs make, store, and get rid of pee (urine) in the body. What are the causes? This is caused by germs (bacteria) in your genital area. These germs grow and cause swelling (inflammation) of your urinary tract. What increases the risk? You are more likely to develop this condition if:  You have a small, thin tube (catheter) to drain pee.  You cannot control when you pee or poop (incontinence).  You are female, and: ? You use these methods to prevent pregnancy: ? A medicine that kills sperm (spermicide). ? A device that blocks sperm (diaphragm). ? You have low levels of a female hormone (estrogen). ? You are pregnant.  You have genes that add to your risk.  You are sexually active.  You take antibiotic medicines.  You have trouble peeing because of: ? A prostate that is bigger than normal, if you are female. ? A blockage in the part of your body that drains pee from the bladder (urethra). ? A kidney stone. ? A nerve condition that affects your bladder (neurogenic bladder). ? Not getting enough to drink. ? Not peeing often enough.  You have other conditions, such as: ? Diabetes. ? A weak disease-fighting system (immune system). ? Sickle cell disease. ? Gout. ? Injury of the spine. What are the signs or symptoms? Symptoms of this condition include:  Needing to pee right away (urgently).  Peeing often.  Peeing small amounts often.  Pain or burning when peeing.  Blood in the pee.  Pee that smells bad or not like normal.  Trouble peeing.  Pee that is cloudy.  Fluid coming from the vagina, if you are female.  Pain in the belly or lower back. Other symptoms include:  Throwing up (vomiting).  No urge to eat.  Feeling mixed up (confused).  Being tired  and grouchy (irritable).  A fever.  Watery poop (diarrhea). How is this treated? This condition may be treated with:  Antibiotic medicine.  Other medicines.  Drinking enough water. Follow these instructions at home:  Medicines  Take over-the-counter and prescription medicines only as told by your doctor.  If you were prescribed an antibiotic medicine, take it as told by your doctor. Do not stop taking it even if you start to feel better. General instructions  Make sure you: ? Pee until your bladder is empty. ? Do not hold pee for a long time. ? Empty your bladder after sex. ? Wipe from front to back after pooping if you are a female. Use each tissue one time when you wipe.  Drink enough fluid to keep your pee pale yellow.  Keep all follow-up visits as told by your doctor. This is important. Contact a doctor if:  You do not get better after 1-2 days.  Your symptoms go away and then come back. Get help right away if:  You have very bad back pain.  You have very bad pain in your lower belly.  You have a fever.  You are sick to your stomach (nauseous).  You are throwing up. Summary  A urinary tract infection (UTI) is an infection of any part of the urinary tract.  This condition is caused by germs in your genital area.  There are many risk factors for a UTI. These include having a small, thin   tube to drain pee and not being able to control when you pee or poop.  Treatment includes antibiotic medicines for germs.  Drink enough fluid to keep your pee pale yellow. This information is not intended to replace advice given to you by your health care provider. Make sure you discuss any questions you have with your health care provider. Document Released: 02/11/2008 Document Revised: 03/04/2018 Document Reviewed: 03/04/2018 Elsevier Interactive Patient Education  2019 [employer].   Parke Simmers Diet A bland diet consists of foods that are often soft and do not have a  lot of fat, fiber, or extra seasonings. Foods without fat, fiber, or seasoning are easier for the body to digest. They are also less likely to irritate your mouth, throat, stomach, and other parts of your digestive system. A bland diet is sometimes called a BRAT diet. What is my plan? Your health care provider or food and nutrition specialist (dietitian) may recommend specific changes to your diet to prevent symptoms or to treat your symptoms. These changes may include:  Eating small meals often.  Cooking food until it is soft enough to chew easily.  Chewing your food well.  Drinking fluids slowly.  Not eating foods that are very spicy, sour, or fatty.  Not eating citrus fruits, such as oranges and grapefruit. What do I need to know about this diet?  Eat a variety of foods from the bland diet food list.  Do not follow a bland diet longer than needed.  Ask your health care provider whether you should take vitamins or supplements. What foods can I eat? Grains  Hot cereals, such as cream of wheat. Rice. Bread, crackers, or tortillas made from refined white flour. Vegetables Canned or cooked vegetables. Mashed or boiled potatoes. Fruits  Bananas. Applesauce. Other types of cooked or canned fruit with the skin and seeds removed, such as canned peaches or pears. Meats and other proteins  Scrambled eggs. Creamy peanut butter or other nut butters. Lean, well-cooked meats, such as chicken or fish. Tofu. Soups or broths. Dairy Low-fat dairy products, such as milk, cottage cheese, or yogurt. Beverages  Water. Herbal tea. Apple juice. Fats and oils Mild salad dressings. Canola or olive oil. Sweets and desserts Pudding. Custard. Fruit gelatin. Ice cream. The items listed above may not be a complete list of recommended foods and beverages. Contact a dietitian for more options. What foods are not recommended? Grains Whole grain breads and cereals. Vegetables Raw  vegetables. Fruits Raw fruits, especially citrus, berries, or dried fruits. Dairy Whole fat dairy foods. Beverages Caffeinated drinks. Alcohol. Seasonings and condiments Strongly flavored seasonings or condiments. Hot sauce. Salsa. Other foods Spicy foods. Fried foods. Sour foods, such as pickled or fermented foods. Foods with high sugar content. Foods high in fiber. The items listed above may not be a complete list of foods and beverages to avoid. Contact a dietitian for more information. Summary  A bland diet consists of foods that are often soft and do not have a lot of fat, fiber, or extra seasonings.  Foods without fat, fiber, or seasoning are easier for the body to digest.  Check with your health care provider to see how long you should follow this diet plan. It is not meant to be followed for long periods. This information is not intended to replace advice given to you by your health care provider. Make sure you discuss any questions you have with your health care provider. Document Released: 12/17/2015 Document Revised: 09/23/2017 Document Reviewed: 09/23/2017  Elsevier Interactive Patient Education  [employer]2019 Else[employer] Inc[employer]  Please take Ciprofloxacin as directed. Please take Ondansetron as needed. Sip on fluids and follow BRAT diet. We will call you when urine culture/sensitivty results are available. FEEL BETTER!

## 2018-09-04 LAB — CULTURE, URINE COMPREHENSIVE

## 2018-09-29 ENCOUNTER — Ambulatory Visit (INDEPENDENT_AMBULATORY_CARE_PROVIDER_SITE_OTHER): Payer: 59 | Admitting: Family Medicine

## 2018-09-29 ENCOUNTER — Encounter: Payer: Self-pay | Admitting: Family Medicine

## 2018-09-29 VITALS — BP 133/85 | HR 76 | Temp 98.2°F | Ht 63.0 in | Wt 131.0 lb

## 2018-09-29 DIAGNOSIS — R311 Benign essential microscopic hematuria: Secondary | ICD-10-CM

## 2018-09-29 DIAGNOSIS — R300 Dysuria: Secondary | ICD-10-CM | POA: Diagnosis not present

## 2018-09-29 DIAGNOSIS — R350 Frequency of micturition: Secondary | ICD-10-CM | POA: Diagnosis not present

## 2018-09-29 MED ORDER — PHENAZOPYRIDINE HCL 200 MG PO TABS: 200.0000 mg | ORAL_TABLET | Freq: Three times a day (TID) | ORAL | 0 refills | Status: AC | PRN

## 2018-09-29 NOTE — Patient Instructions (Signed)
We will let you know about the urine culture results in 3 to 5 days.  If you have not heard from us within the the fifth day, please call us.     Interstitial Cystitis  Interstitial cystitis is inflammation of the bladder. This may cause pain in the bladder area as well as a frequent and urgent need to urinate. The bladder is a hollow organ in the lower part of the abdomen. It stores urine after the urine is made in the kidneys. The severity of interstitial cystitis can vary from person to person. You may have flare-ups, and then your symptoms may go away for a while. For many people, it becomes a long-term (chronic) problem. What are the causes? The cause of this condition is not known. What increases the risk? The following factors may make you more likely to develop this condition:  You are female.  You have fibromyalgia.  You have irritable bowel syndrome (IBS).  You have endometriosis. This condition may be aggravated by:  Stress.  Smoking.  Spicy foods. What are the signs or symptoms? Symptoms of interstitial cystitis vary, and they can change over time. Symptoms may include:  Discomfort or pain in the bladder area, which is in the lower abdomen. Pain can range from mild to severe. The pain may change in intensity as the bladder fills with urine or as it empties.  Pain in the pelvic area, between the hip bones.  An urgent need to urinate.  Frequent urination.  Pain during urination.  Pain during sex.  Blood in the urine. For women, symptoms often get worse during menstruation. How is this diagnosed? This condition is diagnosed based on your symptoms, your medical history, and a physical exam. You may have tests to rule out other conditions, such as:  Urine tests.  Cystoscopy. For this test, a tool similar to a very thin telescope is used to look into your bladder.  Biopsy. This involves taking a sample of tissue from the bladder to be examined under a  microscope. How is this treated? There is no cure for this condition, but treatment can help you control your symptoms. Work closely with your health care provider to find the most effective treatments for you. Treatment options may include:  Medicines to relieve pain and reduce how often you feel the need to urinate.  Learning ways to control when you urinate (bladder training).  Lifestyle changes, such as changing your diet or taking steps to control stress.  Using a device that provides electrical stimulation to your nerves, which can relieve pain (neuromodulation therapy). The device is placed on your back, where it blocks the nerves that cause you to feel pain in your bladder area.  A procedure that stretches your bladder by filling it with air or fluid.  Surgery. This is rare. It is only done for extreme cases, if other treatments do not help. Follow these instructions at home: Bladder training   Use bladder training techniques as directed. Techniques may include: ? Urinating at scheduled times. ? Training yourself to delay urination. ? Doing exercises (Kegel exercises) to strengthen the muscles that control urine flow.  Keep a bladder diary. ? Write down the times that you urinate and any symptoms that you have. This can help you find out which foods, liquids, or activities make your symptoms worse. ? Use your bladder diary to schedule bathroom trips. If you are away from home, plan to be near a bathroom at each of your scheduled  times.  Make sure that you urinate just before you leave the house and just before you go to bed. Eating and drinking  Make dietary changes as recommended by your health care provider. You may need to avoid: ? Spicy foods. ? Foods that contain a lot of potassium.  Limit your intake of beverages that make you need to urinate. These include: ? Caffeinated beverages like soda, coffee, and tea. ? Alcohol. General instructions  Take over-the-counter  and prescription medicines only as told by your health care provider.  Do not drink alcohol.  You can try a warm or cool compress over your bladder for comfort.  Avoid wearing tight clothing.  Do not use any products that contain nicotine or tobacco, such as cigarettes and e-cigarettes. If you need help quitting, ask your health care provider.  Keep all follow-up visits as told by your health care provider. This is important. Contact a health care provider if you have:  Symptoms that do not get better with treatment.  Pain or discomfort that gets worse.  More frequent urges to urinate.  A fever. Get help right away if:  You have no control over when you urinate. Summary  Interstitial cystitis is inflammation of the bladder.  This condition may cause pain in the bladder area as well as a frequent and urgent need to urinate.  You may have flare-ups of the condition, and then it may go away for a while. For many people, it becomes a long-term (chronic) problem.  There is no cure for interstitial cystitis, but treatment methods are available to control your symptoms. This information is not intended to replace advice given to you by your health care provider. Make sure you discuss any questions you have with your health care provider. Document Released: 04/25/2004 Document Revised: 07/20/2017 Document Reviewed: 07/20/2017 Elsevier Interactive Patient Education  2019 Elsevier Inc.   Urinary Tract Infection, Adult  A urinary tract infection (UTI) is an infection of any part of the urinary tract. The urinary tract includes the kidneys, ureters, bladder, and urethra. These organs make, store, and get rid of urine in the body. Your health care provider may use other names to describe the infection. An upper UTI affects the ureters and kidneys (pyelonephritis). A lower UTI affects the bladder (cystitis) and urethra (urethritis). What are the causes? Most urinary tract infections are  caused by bacteria in your genital area, around the entrance to your urinary tract (urethra). These bacteria grow and cause inflammation of your urinary tract. What increases the risk? You are more likely to develop this condition if:  You have a urinary catheter that stays in place (indwelling).  You are not able to control when you urinate or have a bowel movement (you have incontinence).  You are female and you: ? Use a spermicide or diaphragm for birth control. ? Have low estrogen levels. ? Are pregnant.  You have certain genes that increase your risk (genetics).  You are sexually active.  You take antibiotic medicines.  You have a condition that causes your flow of urine to slow down, such as: ? An enlarged prostate, if you are female. ? Blockage in your urethra (stricture). ? A kidney stone. ? A nerve condition that affects your bladder control (neurogenic bladder). ? Not getting enough to drink, or not urinating often.  You have certain medical conditions, such as: ? Diabetes. ? A weak disease-fighting system (immunesystem). ? Sickle cell disease. ? Gout. ? Spinal cord injury. What are the  signs or symptoms? Symptoms of this condition include:  Needing to urinate right away (urgently).  Frequent urination or passing small amounts of urine frequently.  Pain or burning with urination.  Blood in the urine.  Urine that smells bad or unusual.  Trouble urinating.  Cloudy urine.  Vaginal discharge, if you are female.  Pain in the abdomen or the lower back. You may also have:  Vomiting or a decreased appetite.  Confusion.  Irritability or tiredness.  A fever.  Diarrhea. The first symptom in older adults may be confusion. In some cases, they may not have any symptoms until the infection has worsened. How is this diagnosed? This condition is diagnosed based on your medical history and a physical exam. You may also have other tests, including:  Urine  tests.  Blood tests.  Tests for sexually transmitted infections (STIs). If you have had more than one UTI, a cystoscopy or imaging studies may be done to determine the cause of the infections. How is this treated? Treatment for this condition includes:  Antibiotic medicine.  Over-the-counter medicines to treat discomfort.  Drinking enough water to stay hydrated. If you have frequent infections or have other conditions such as a kidney stone, you may need to see a health care provider who specializes in the urinary tract (urologist). In rare cases, urinary tract infections can cause sepsis. Sepsis is a life-threatening condition that occurs when the body responds to an infection. Sepsis is treated in the hospital with IV antibiotics, fluids, and other medicines. Follow these instructions at home:  Medicines  Take over-the-counter and prescription medicines only as told by your health care provider.  If you were prescribed an antibiotic medicine, take it as told by your health care provider. Do not stop using the antibiotic even if you start to feel better. General instructions  Make sure you: ? Empty your bladder often and completely. Do not hold urine for long periods of time. ? Empty your bladder after sex. ? Wipe from front to back after a bowel movement if you are female. Use each tissue one time when you wipe.  Drink enough fluid to keep your urine pale yellow.  Keep all follow-up visits as told by your health care provider. This is important. Contact a health care provider if:  Your symptoms do not get better after 1-2 days.  Your symptoms go away and then return. Get help right away if you have:  Severe pain in your back or your lower abdomen.  A fever.  Nausea or vomiting. Summary  A urinary tract infection (UTI) is an infection of any part of the urinary tract, which includes the kidneys, ureters, bladder, and urethra.  Most urinary tract infections are caused  by bacteria in your genital area, around the entrance to your urinary tract (urethra).  Treatment for this condition often includes antibiotic medicines.  If you were prescribed an antibiotic medicine, take it as told by your health care provider. Do not stop using the antibiotic even if you start to feel better.  Keep all follow-up visits as told by your health care provider. This is important. This information is not intended to replace advice given to you by your health care provider. Make sure you discuss any questions you have with your health care provider. Document Released: 06/04/2005 Document Revised: 03/04/2018 Document Reviewed: 03/04/2018 Elsevier Interactive Patient Education  2019 [employer].

## 2018-09-29 NOTE — Progress Notes (Deleted)
Subjective:    Patient ID: Sydney [name], female    DOB: Apr 01, 1971, 48 y.o.   MRN: [id number]  HPI:  Ms. Conley presents with  Patient Care Team    Relationship Specialty Notifications Start End  Mellody Dance, DO PCP - General Family Medicine  04/22/18   Bobbye Charleston, MD Consulting Physician Obstetrics and Gynecology  04/22/18   Gatha Mayer, MD Consulting Physician Gastroenterology  07/19/18    Comment: scopes/ Chrohn's    Patient Active Problem List   Diagnosis Date Noted  . Flu-like symptoms 09/02/2018  . Abnormal urinalysis 09/02/2018  . Flank pain 09/02/2018  . Fever 09/02/2018  . Nodule of neck 07/19/2018  . Smoker 04/22/2018  . Tobacco abuse counseling 04/22/2018  . Plantar wart of left foot 04/22/2018  . Crohn's disease of large intestine with complication ([place]) 70/96/2836  . ANXIETY DEPRESSION 12/17/2009  . DYSPHAGIA 12/17/2009  . GERD 01/21/2008  . HIATAL HERNIA 01/21/2008     Past Medical History:  Diagnosis Date  . ADD (attention deficit disorder)   . Crohn disease ([place])   . Crohn's colitis ([place])   . Depression with anxiety   . GERD (gastroesophageal reflux disease)   . History of iron deficiency anemia   . Nephrolithiasis      Past Surgical History:  Procedure Laterality Date  . ABDOMINAL HYSTERECTOMY  2011  . CESAREAN SECTION     x 3  . CHOLECYSTECTOMY    . COLONOSCOPY W/ BIOPSIES AND POLYPECTOMY  12/24/2009   pseudopolyps (hyperplastic), crohn's   . TONSILLECTOMY  1974  . UPPER GASTROINTESTINAL ENDOSCOPY  12/24/2009   w/bx, gastritis     Family History  Problem Relation Age of Onset  . Crohn's disease Mother   . Crohn's disease Unknown        grandmother  . Colon cancer Unknown        great grandmother     Social History   Substance and Sexual Activity  Drug Use Never     Social History   Substance and Sexual Activity  Alcohol Use Yes   Comment: occ.     Social History   Tobacco Use  Smoking Status Current  Every Day Smoker  . Types: Cigars  Smokeless Tobacco Never Used  Tobacco Comment   2 small cigars a day     Outpatient Encounter Medications as of 09/29/2018  Medication Sig  . ciprofloxacin (CIPRO) 500 MG tablet Take 1 tablet (500 mg total) by mouth 2 (two) times daily.  Marland Kitchen FLUoxetine (PROZAC) 40 MG capsule Take 1 capsule by mouth daily.  . ondansetron (ZOFRAN) 4 MG tablet Take 1 tablet (4 mg total) by mouth every 8 (eight) hours as needed for nausea or vomiting.   No facility-administered encounter medications on file as of 09/29/2018.     Allergies: Metronidazole and Penicillins  There is no height or weight on file to calculate BMI.  There were no vitals taken for this visit.     Review of Systems     Objective:   Physical Exam        Assessment & Plan:    Patient Care Team    Relationship Specialty Notifications Start End  Mellody Dance, DO PCP - General Family Medicine  04/22/18   Bobbye Charleston, MD Consulting Physician Obstetrics and Gynecology  04/22/18   Gatha Mayer, MD Consulting Physician Gastroenterology  07/19/18    Comment: scopes/ Chrohn's    Patient Active Problem List   Diagnosis  Date Noted  . Flu-like symptoms 09/02/2018  . Abnormal urinalysis 09/02/2018  . Flank pain 09/02/2018  . Fever 09/02/2018  . Nodule of neck 07/19/2018  . Smoker 04/22/2018  . Tobacco abuse counseling 04/22/2018  . Plantar wart of left foot 04/22/2018  . Crohn's disease of large intestine with complication ([place]) 29/79/8921  . ANXIETY DEPRESSION 12/17/2009  . DYSPHAGIA 12/17/2009  . GERD 01/21/2008  . HIATAL HERNIA 01/21/2008     Past Medical History:  Diagnosis Date  . ADD (attention deficit disorder)   . Crohn disease ([place])   . Crohn's colitis ([place])   . Depression with anxiety   . GERD (gastroesophageal reflux disease)   . History of iron deficiency anemia   . Nephrolithiasis      Past Surgical History:  Procedure Laterality Date  .  ABDOMINAL HYSTERECTOMY  2011  . CESAREAN SECTION     x 3  . CHOLECYSTECTOMY    . COLONOSCOPY W/ BIOPSIES AND POLYPECTOMY  12/24/2009   pseudopolyps (hyperplastic), crohn's   . TONSILLECTOMY  1974  . UPPER GASTROINTESTINAL ENDOSCOPY  12/24/2009   w/bx, gastritis     Family History  Problem Relation Age of Onset  . Crohn's disease Mother   . Crohn's disease Unknown        grandmother  . Colon cancer Unknown        great grandmother     Social History   Substance and Sexual Activity  Drug Use Never     Social History   Substance and Sexual Activity  Alcohol Use Yes   Comment: occ.     Social History   Tobacco Use  Smoking Status Current Every Day Smoker  . Types: Cigars  Smokeless Tobacco Never Used  Tobacco Comment   2 small cigars a day     Outpatient Encounter Medications as of 09/29/2018  Medication Sig  . ciprofloxacin (CIPRO) 500 MG tablet Take 1 tablet (500 mg total) by mouth 2 (two) times daily.  Marland Kitchen FLUoxetine (PROZAC) 40 MG capsule Take 1 capsule by mouth daily.  . ondansetron (ZOFRAN) 4 MG tablet Take 1 tablet (4 mg total) by mouth every 8 (eight) hours as needed for nausea or vomiting.   No facility-administered encounter medications on file as of 09/29/2018.     Allergies: Metronidazole and Penicillins  There is no height or weight on file to calculate BMI.  There were no vitals taken for this visit.

## 2018-09-29 NOTE — Progress Notes (Signed)
Impression and Recommendations:    1. Frequency of urination   2. Benign essential microscopic hematuria   3. Dysuria     1. Possible UTI - Patient with recent UTI around 09/02/2018.  Antibiotics were given at that time.  - Urinalysis taken today.  Urine will be cultured today.  Will prescribe antibiotics if positive  - If culture comes back showing another urinary infection less than 30 days from prior, patient will need additional antibiotics and understands that she may need referral to urology for further management.  -However upon further questioning, per patient, having MUCH more frequent sexual activity than usual.   Advised patient that she should urinate after every instance of sexual activity-which she has not been doing.  - Drink more water, hydrate adequately; half of your weight in ounces of water per day (70 ounces).  - Advised patient to take AZO as provided. - Utilize tylenol to help manage her pain.   Education and routine counseling performed. Handouts provided.    Orders Placed This Encounter  Procedures  . Urine Culture  . POCT urinalysis dipstick    Medications Discontinued During This Encounter  Medication Reason  . ciprofloxacin (CIPRO) 500 MG tablet Completed Course  . FLUoxetine (PROZAC) 40 MG capsule Patient Preference  . ondansetron (ZOFRAN) 4 MG tablet Completed Course     Meds ordered this encounter  Medications  . phenazopyridine (PYRIDIUM) 200 MG tablet    Sig: Take 1 tablet (200 mg total) by mouth 3 (three) times daily as needed for up to 2 days for pain.    Dispense:  15 tablet    Refill:  0    The patient was counseled, risk factors were discussed, anticipatory guidance given.  Gross side effects, risk and benefits, and alternatives of medications discussed with patient.  Patient is aware that all medications have potential side effects and we are unable to predict every side effect or drug-drug interaction that may  occur.  Expresses verbal understanding and consents to current therapy plan and treatment regimen.   Return if symptoms worsen or fail to improve, for F-up of current med issues as previously d/c pt.  This document serves as a record of services personally performed by [name], DO. It was created on her behalf by Peggye Fothergill, a trained medical scribe. The creation of this record is based on the scribe's personal observations and the provider's statements to them.   I have reviewed the above medical documentation for accuracy and completeness and I concur.  [name], DO 10/01/2018 9:48 AM       Subjective:    HPI: Sydney [name] is a 48 y.o. female who presents to [facility] today for c/o suspected UTI.  Came in the day after Christmas.  Saw Katy at the time.  States around [place], she got really sick.  Was throwing up, had fever, aches, pains, etc.  Thought she had the flu, but had a UTI at the time (Escherichia coli infection) 09/02/2018.  Got better after taking antibiotics, "no problems, good to go."  Current symptoms for about four days, starting this past Sunday.  C/O:  Needing to urinate all the time, urgency, she "cannot hold her pee," and noted "a trace of blood" on Sunday (4 days ago).  Experiencing mild suprapubic pressure.  Denies:  Denies fever, chills, back pain, abdominal pain, nausea.  She has been having much more sex  than usual.  States she tries to pee after every time she has sex, but isn't sure if she always is.  Urinalysis    Component Value Date/Time   BILIRUBINUR negative 09/02/2018 1643   PROTEINUR Positive (A) 09/02/2018 1643   UROBILINOGEN 0.2 09/02/2018 1643   NITRITE positive 09/02/2018 1643   LEUKOCYTESUR Small (1+) (A) 09/02/2018 1643    Wt Readings from Last 3 Encounters:  09/29/18 131 lb (59.4 kg)  09/02/18 133 lb 9.6 oz (60.6 kg)  07/19/18 140 lb (63.5 kg)   BP Readings from Last 3  Encounters:  09/29/18 133/85  09/02/18 106/69  07/19/18 114/77   Pulse Readings from Last 3 Encounters:  09/29/18 76  09/02/18 89  07/19/18 71   BMI Readings from Last 3 Encounters:  09/29/18 23.21 kg/m  09/02/18 23.67 kg/m  07/19/18 24.80 kg/m     Patient Active Problem List   Diagnosis Date Noted  . Flu-like symptoms 09/02/2018  . Abnormal urinalysis 09/02/2018  . Flank pain 09/02/2018  . Fever 09/02/2018  . Nodule of neck 07/19/2018  . Smoker 04/22/2018  . Tobacco abuse counseling 04/22/2018  . Plantar wart of left foot 04/22/2018  . Crohn's disease of large intestine with complication ([facility]) 12/28/2009  . ANXIETY DEPRESSION 12/17/2009  . DYSPHAGIA 12/17/2009  . GERD 01/21/2008  . HIATAL HERNIA 01/21/2008    Past Surgical History:  Procedure Laterality Date  . ABDOMINAL HYSTERECTOMY  2011  . CESAREAN SECTION     x 3  . CHOLECYSTECTOMY    . COLONOSCOPY W/ BIOPSIES AND POLYPECTOMY  12/24/2009   pseudopolyps (hyperplastic), crohn's   . TONSILLECTOMY  1974  . UPPER GASTROINTESTINAL ENDOSCOPY  12/24/2009   w/bx, gastritis    Family History  Problem Relation Age of Onset  . Crohn's disease Mother   . Crohn's disease Unknown        grandmother  . Colon cancer Unknown        great grandmother    Social History   Substance and Sexual Activity  Drug Use Never  ,  Social History   Substance and Sexual Activity  Alcohol Use Yes   Comment: occ.  ,  Social History   Tobacco Use  Smoking Status Current Every Day Smoker  . Types: Cigars  Smokeless Tobacco Never Used  Tobacco Comment   2 small cigars a day  ,  Social History   Substance and Sexual Activity  Sexual Activity Yes  . Birth control/protection: None    Patient's Medications  New Prescriptions   PHENAZOPYRIDINE (PYRIDIUM) 200 MG TABLET    Take 1 tablet (200 mg total) by mouth 3 (three) times daily as needed for up to 2 days for pain.  Previous Medications   No medications on file    Modified Medications   No medications on file  Discontinued Medications   CIPROFLOXACIN (CIPRO) 500 MG TABLET    Take 1 tablet (500 mg total) by mouth 2 (two) times daily.   FLUOXETINE (PROZAC) 40 MG CAPSULE    Take 1 capsule by mouth daily.   ONDANSETRON (ZOFRAN) 4 MG TABLET    Take 1 tablet (4 mg total) by mouth every 8 (eight) hours as needed for nausea or vomiting.    Metronidazole and Penicillins  No outpatient medications have been marked as taking for the 09/29/18 encounter (Office Visit) with [name], DO.    Review of Systems: General:   No F/C, wt loss Pulm:   No DIB, pleuritic chest pain  Card:  No CP, palpitations Abd:  No n/v/d or pain GU:  Dysuria, increased frequency and urgency; no vaginal discharge Ext:  No inc edema from baseline   Objective:  Blood pressure 133/85, pulse 76, temperature 98.2 F (36.8 C), height 5\' 3"  (1.6 m), weight 131 lb (59.4 kg), SpO2 100 %. Body mass index is 23.21 kg/m.  General: Well Developed, well nourished, and in no acute distress.  HEENT: Normocephalic, atraumatic Skin: Warm and dry, cap RF less 2 sec, good turgor CV: +S1, S2, regular rate rhythm. Respiratory: ECTA B/L; speaking in full sentences, no conversational dyspnea Abd: Soft, NT, ND, No G/R/R, no SPT, No flank pain NeuroM-Sk: Ambulates w/o assistance, moves * 4 Psych: A and O *3

## 2018-10-01 ENCOUNTER — Other Ambulatory Visit: Payer: Self-pay

## 2018-10-01 DIAGNOSIS — R350 Frequency of micturition: Secondary | ICD-10-CM

## 2018-10-01 LAB — URINE CULTURE

## 2018-10-01 LAB — POCT URINALYSIS DIPSTICK
BILIRUBIN UA: NEGATIVE
GLUCOSE UA: NEGATIVE
KETONES UA: NEGATIVE
Nitrite, UA: NEGATIVE
PH UA: 5.5 (ref 5.0–8.0)
Protein, UA: POSITIVE — AB
Spec Grav, UA: <=1.005 — AB (ref 1.010–1.025)
UROBILINOGEN UA: 0.2 U/dL

## 2018-10-01 MED ORDER — NITROFURANTOIN MONOHYD MACRO 100 MG PO CAPS: 100.0000 mg | ORAL_CAPSULE | Freq: Two times a day (BID) | ORAL | 0 refills | Status: DC

## 2018-10-01 NOTE — Progress Notes (Signed)
Per result note from Dr. Raliegh Scarlet sent in [place] for [place]. Patient notified. MPulliam, CMA/RT(R)

## 2020-07-19 ENCOUNTER — Ambulatory Visit (INDEPENDENT_AMBULATORY_CARE_PROVIDER_SITE_OTHER): Payer: 59 | Admitting: Internal Medicine

## 2020-07-19 ENCOUNTER — Other Ambulatory Visit (INDEPENDENT_AMBULATORY_CARE_PROVIDER_SITE_OTHER): Payer: 59

## 2020-07-19 ENCOUNTER — Encounter: Payer: Self-pay | Admitting: Internal Medicine

## 2020-07-19 VITALS — BP 110/80 | HR 68 | Ht 63.0 in | Wt 129.0 lb

## 2020-07-19 DIAGNOSIS — R1013 Epigastric pain: Secondary | ICD-10-CM

## 2020-07-19 DIAGNOSIS — K50118 Crohn's disease of large intestine with other complication: Secondary | ICD-10-CM | POA: Diagnosis not present

## 2020-07-19 DIAGNOSIS — R1319 Other dysphagia: Secondary | ICD-10-CM

## 2020-07-19 LAB — LIPASE: Lipase: 34 U/L (ref 11.0–59.0)

## 2020-07-19 LAB — COMPREHENSIVE METABOLIC PANEL
ALT: 9 U/L (ref 0–35)
AST: 11 U/L (ref 0–37)
Albumin: 4.1 g/dL (ref 3.5–5.2)
Alkaline Phosphatase: 93 U/L (ref 39–117)
BUN: 17 mg/dL (ref 6–23)
CO2: 29 mEq/L (ref 19–32)
Calcium: 9.1 mg/dL (ref 8.4–10.5)
Chloride: 103 mEq/L (ref 96–112)
Creatinine, Ser: 1.06 mg/dL (ref 0.40–1.20)
GFR: 60.75 mL/min (ref >60.00)
Glucose, Bld: 94 mg/dL (ref 70–99)
Potassium: 4 mEq/L (ref 3.5–5.1)
Sodium: 138 mEq/L (ref 135–145)
Total Bilirubin: 0.7 mg/dL (ref 0.2–1.2)
Total Protein: 7.1 g/dL (ref 6.0–8.3)

## 2020-07-19 LAB — CBC WITH DIFFERENTIAL/PLATELET
Basophils Absolute: 0.1 10*3/uL (ref 0.0–0.1)
Basophils Relative: 1 % (ref 0.0–3.0)
Eosinophils Absolute: 0.1 10*3/uL (ref 0.0–0.7)
Eosinophils Relative: 2 % (ref 0.0–5.0)
HCT: 39 % (ref 36.0–46.0)
Hemoglobin: 13.4 g/dL (ref 12.0–15.0)
Lymphocytes Relative: 22 % (ref 12.0–46.0)
Lymphs Abs: 1.5 10*3/uL (ref 0.7–4.0)
MCHC: 34.4 g/dL (ref 30.0–36.0)
MCV: 97.7 fl (ref 78.0–100.0)
Monocytes Absolute: 0.5 10*3/uL (ref 0.1–1.0)
Monocytes Relative: 7.9 % (ref 3.0–12.0)
Neutro Abs: 4.5 10*3/uL (ref 1.4–7.7)
Neutrophils Relative %: 67.1 % (ref 43.0–77.0)
Platelets: 246 10*3/uL (ref 150.0–400.0)
RBC: 3.99 Mil/uL (ref 3.87–5.11)
RDW: 12.9 % (ref 11.5–15.5)
WBC: 6.7 10*3/uL (ref 4.0–10.5)

## 2020-07-19 LAB — C-REACTIVE PROTEIN: CRP: 1.9 mg/dL (ref 0.5–20.0)

## 2020-07-19 NOTE — Progress Notes (Signed)
Sydney [name] 49 y.o. Jul 29, 1971 [id number]  Assessment & Plan:   Encounter Diagnoses  Name Primary?  . Abdominal pain, epigastric Yes  . Crohn's disease of colon with other complication ([place])   . DYSPHAGIA     Long time without endoscopic evaluation and not on Crohn's therapy.  Possibility of small bowel obstruction developing colon cancer, upper GI disease.  When last examined in 2011 she really did not have active disease.  Start work-up with an EGD and a colonoscopy.The risks and benefits as well as alternatives of endoscopic procedure(s) have been discussed and reviewed. All questions answered. The patient agrees to proceed.   She may very well need cross-sectional imaging as well.  Further treatment plans pending diagnostic evaluation ideally she should be on Crohn's specific therapy Labs as below. Orders Placed This Encounter  Procedures  . CBC with Differential/Platelet  . C-reactive protein  . Comprehensive metabolic panel  . Lipase  . Ambulatory referral to Gastroenterology    I appreciate the opportunity to care for this patient.   Subjective:   Chief Complaint: Epigastric pain gas and bloating in the setting of Crohn's disease  HPI Sydney [name] is a 49 year old woman previously followed by Dr. Velora Heckler and then briefly by me before not returning for follow-up since 2011 colonoscopy, with Crohn's colitis.  He says she has been gaining some weight feels a lot of bloating and pressure particularly after eating.  Chronic diarrhea remains a problem.  Tends not to have nocturnal stools but has major severe postprandial diarrhea can be watery can be formed.  Things been getting worse lately so she decided to present for evaluation.  Asking about having another colonoscopy.  She says her symptoms are different than her typical Crohn's problems.  Appetite is gone though weight is going up and she has a lot of nausea and belching.  She does not tend to have vomiting.  She is  smoking marijuana on a daily basis and she said this is the only thing she is found that helps her with her Crohn's symptoms though it does not stop diarrhea and relieves pain in general.  However that is not the case now.  Has a little bit of trouble with pill dysphagia and perhaps food dysphagia at times with suprasternal sticking point.  Not common.   Last colonoscopy 2011      1) Normal terminal ileum     2) Pseudopolyps (suspected) throughout the colon - biopsied     3) Abnormal mucosa throughout the colon - patchy     hyperpigmentation and scalloped mucosa without obvious     inflammation - biopsied     4) Crohn's disease - known though not much disease activity seen     today except for pseudopolyps   Last EGD 2011      ENDOSCOPIC IMPRESSION:     1) Mild gastritis - biopsied     2) Otherwise normal examination   Pathology reports from 2011 1. STOMACH, BIOPSY, : - BENIGN GASTRIC MUCOSA WITH FOCAL REACTIVE EPITHELIAL  CHANGES.  - NO HELICOBACTER PYLORI, INTESTINAL METAPLASIA OR ACTIVE INFLAMMATION  IDENTIFIED.  2. COLON, BIOPSY, CECUM TO HEPATIC FLEXURE : - BENIGN COLONIC MUCOSA.NO ACTIVE  INFLAMMATION OR GRANULOMATA IDENTIFIED.  - HYPERPLASTIC POLYP(S)  - NO ADENOMATOUS EPITHELIUM OR MALIGNANCY IDENTIFIED.  3. COLON, BIOPSY, TRANSVERSE : - BENIGN COLONIC MUCOSA.NO ACTIVE INFLAMMATION  OR GRANULOMATA IDENTIFIED.  - HYPERPLASTIC POLYP(S)  - NO ADENOMATOUS EPITHELIUM OR MALIGNANCY IDENTIFIED.  4. COLON, BIOPSY, SPLENIC  FLEXURE AND DESCENDING : - BENIGN COLONIC MUCOSA.NO  ACTIVE INFLAMMATION OR GRANULOMATA IDENTIFIED.  - HYPERPLASTIC POLYP(S)  - NO ADENOMATOUS EPITHELIUM OR MALIGNANCY IDENTIFIED.  5. COLON, BIOPSY, SIGMOID AND RECTUM : - BENIGN COLONIC MUCOSA.NO ACTIVE  INFLAMMATION OR GRANULOMATA IDENTIFIED.  - HYPERPLASTIC POLYP(S)  - NO ADENOMATOUS EPITHELIUM OR MALIGNANCY IDENTIFIED.   Wt Readings from Last 3 Encounters:  07/19/20 129 lb (58.5 kg)   09/29/18 131 lb (59.4 kg)  09/02/18 133 lb 9.6 oz (60.6 kg)   Review of systems positive for anxiety situational stress separated from her husband for a year chronic insomnia.  Some urinary frequency.  No significant stress urinary incontinence.  All other review of systems are negative. Allergies  Allergen Reactions  . Metronidazole   . Penicillins    Current Meds  Medication Sig  . FLUoxetine (PROZAC) 40 MG capsule Take 40 mg by mouth daily.   Past Medical History:  Diagnosis Date  . ADD (attention deficit disorder)   . Crohn's colitis ([place])   . Depression with anxiety   . GERD (gastroesophageal reflux disease)   . History of iron deficiency anemia   . Nephrolithiasis    Past Surgical History:  Procedure Laterality Date  . ABDOMINAL HYSTERECTOMY  2011  . CESAREAN SECTION     x 3  . CHOLECYSTECTOMY    . COLONOSCOPY W/ BIOPSIES AND POLYPECTOMY  12/24/2009   pseudopolyps (hyperplastic), crohn's   . TONSILLECTOMY  1974  . UPPER GASTROINTESTINAL ENDOSCOPY  12/24/2009   w/bx, gastritis   Social History   Social History Narrative   She is married but separated from her only husband   2 grown sons in their 80s and a teenage daughter as of 2021   Patient lives with her daughter at this time she works Education administrator houses   Occasional rare alcohol, cigarette smoker marijuana smoker no other drug use and no alcohol   family history includes Colon cancer in an other family member; Crohn's disease in her mother and another family member.   Review of Systems As per HPI  Objective:   Physical Exam BP 110/80   Pulse 68   Ht 5' 3"  (1.6 m)   Wt 129 lb (58.5 kg)   BMI 22.85 kg/m  NAD Anicteric Mouth clear Chest cta Cor NL abd soft mildly tender epigastrium and some fullness in RLQ Alert and oriented x 3

## 2020-07-19 NOTE — Patient Instructions (Addendum)
COLONOSCOPY AND ENDOSCOPY: You have been scheduled for a endoscopy and colonoscopy. Please follow written instructions given to you at your visit today.   PREP: Please pick up your prep supplies at the pharmacy within the next 1-3 days.  INHALERS: If you use inhalers (even only as needed), please bring them with you on the day of your procedure.  LABS: Your provider has requested that you go to the basement level for lab work before leaving today. Press "B" on the elevator. The lab is located at the first door on the left as you exit the elevator.  HEALTHCARE LAWS AND MY CHART RESULTS: Due to recent changes in healthcare laws, you may see the results of your imaging and laboratory studies on MyChart before your provider has had a chance to review them.  We understand that in some cases there may be results that are confusing or concerning to you. Not all laboratory results come back in the same time frame and the provider may be waiting for multiple results in order to interpret others.  Please give [place] 48 hours in order for your provider to thoroughly review all the results before contacting the office for clarification of your results.   If you are age 51 or younger, your body mass index should be between 19-25. Your Body mass index is 22.85 kg/m. If this is out of the aformentioned range listed, please consider follow up with your Primary Care Provider.   Thank you for trusting me with your gastrointestinal care!    Silvano Rusk, MD., [facility]

## 2020-07-20 ENCOUNTER — Emergency Department (HOSPITAL_COMMUNITY): Payer: 59

## 2020-07-20 ENCOUNTER — Inpatient Hospital Stay (HOSPITAL_COMMUNITY): Payer: 59 | Admitting: Certified Registered Nurse Anesthetist

## 2020-07-20 ENCOUNTER — Other Ambulatory Visit: Payer: Self-pay

## 2020-07-20 ENCOUNTER — Ambulatory Visit (HOSPITAL_COMMUNITY)
Admission: AD | Admit: 2020-07-20 | Discharge: 2020-07-20 | Disposition: A | Discharge status: Home / Self Care | Payer: 59 | Source: Ambulatory Visit | Attending: Urology | Admitting: Urology

## 2020-07-20 ENCOUNTER — Encounter (HOSPITAL_COMMUNITY)
Admission: AD | Disposition: A | Discharge status: Home / Self Care | Payer: Self-pay | Source: Ambulatory Visit | Attending: Urology

## 2020-07-20 ENCOUNTER — Encounter (HOSPITAL_COMMUNITY): Payer: Self-pay | Admitting: Emergency Medicine

## 2020-07-20 ENCOUNTER — Emergency Department (HOSPITAL_COMMUNITY)
Admission: EM | Admit: 2020-07-20 | Discharge: 2020-07-20 | Disposition: A | Discharge status: Home / Self Care | Payer: 59 | Source: Home / Self Care | Attending: Emergency Medicine | Admitting: Emergency Medicine

## 2020-07-20 ENCOUNTER — Encounter (HOSPITAL_COMMUNITY): Payer: Self-pay | Admitting: Urology

## 2020-07-20 ENCOUNTER — Inpatient Hospital Stay (HOSPITAL_COMMUNITY): Payer: 59

## 2020-07-20 ENCOUNTER — Other Ambulatory Visit: Payer: Self-pay | Admitting: Urology

## 2020-07-20 DIAGNOSIS — F988 Other specified behavioral and emotional disorders with onset usually occurring in childhood and adolescence: Secondary | ICD-10-CM | POA: Insufficient documentation

## 2020-07-20 DIAGNOSIS — Z87442 Personal history of urinary calculi: Secondary | ICD-10-CM | POA: Insufficient documentation

## 2020-07-20 DIAGNOSIS — B957 Other staphylococcus as the cause of diseases classified elsewhere: Secondary | ICD-10-CM | POA: Insufficient documentation

## 2020-07-20 DIAGNOSIS — Z1629 Resistance to other single specified antibiotic: Secondary | ICD-10-CM | POA: Diagnosis not present

## 2020-07-20 DIAGNOSIS — N201 Calculus of ureter: Secondary | ICD-10-CM

## 2020-07-20 DIAGNOSIS — N200 Calculus of kidney: Secondary | ICD-10-CM

## 2020-07-20 DIAGNOSIS — K5090 Crohn's disease, unspecified, without complications: Secondary | ICD-10-CM | POA: Insufficient documentation

## 2020-07-20 DIAGNOSIS — F17290 Nicotine dependence, other tobacco product, uncomplicated: Secondary | ICD-10-CM | POA: Insufficient documentation

## 2020-07-20 DIAGNOSIS — Z8379 Family history of other diseases of the digestive system: Secondary | ICD-10-CM | POA: Insufficient documentation

## 2020-07-20 DIAGNOSIS — N136 Pyonephrosis: Secondary | ICD-10-CM | POA: Insufficient documentation

## 2020-07-20 DIAGNOSIS — Z79899 Other long term (current) drug therapy: Secondary | ICD-10-CM | POA: Insufficient documentation

## 2020-07-20 DIAGNOSIS — Z800 Family history of malignant neoplasm of digestive organs: Secondary | ICD-10-CM | POA: Insufficient documentation

## 2020-07-20 DIAGNOSIS — Z880 Allergy status to penicillin: Secondary | ICD-10-CM | POA: Insufficient documentation

## 2020-07-20 DIAGNOSIS — Z881 Allergy status to other antibiotic agents status: Secondary | ICD-10-CM | POA: Insufficient documentation

## 2020-07-20 DIAGNOSIS — N132 Hydronephrosis with renal and ureteral calculous obstruction: Secondary | ICD-10-CM | POA: Diagnosis present

## 2020-07-20 DIAGNOSIS — Z20822 Contact with and (suspected) exposure to covid-19: Secondary | ICD-10-CM | POA: Diagnosis not present

## 2020-07-20 DIAGNOSIS — Z1611 Resistance to penicillins: Secondary | ICD-10-CM | POA: Diagnosis not present

## 2020-07-20 DIAGNOSIS — K219 Gastro-esophageal reflux disease without esophagitis: Secondary | ICD-10-CM | POA: Insufficient documentation

## 2020-07-20 HISTORY — PX: CYSTOSCOPY W/ URETERAL STENT PLACEMENT: SHX1429

## 2020-07-20 LAB — CBC WITH DIFFERENTIAL/PLATELET
Abs Immature Granulocytes: 0.03 10*3/uL (ref 0.00–0.07)
Basophils Absolute: 0.1 10*3/uL (ref 0.0–0.1)
Basophils Relative: 1 %
Eosinophils Absolute: 0.1 10*3/uL (ref 0.0–0.5)
Eosinophils Relative: 1 %
HCT: 38.2 % (ref 36.0–46.0)
Hemoglobin: 12.5 g/dL (ref 12.0–15.0)
Immature Granulocytes: 0 %
Lymphocytes Relative: 14 %
Lymphs Abs: 1.2 10*3/uL (ref 0.7–4.0)
MCH: 32.7 pg (ref 26.0–34.0)
MCHC: 32.7 g/dL (ref 30.0–36.0)
MCV: 100 fL (ref 80.0–100.0)
Monocytes Absolute: 0.7 10*3/uL (ref 0.1–1.0)
Monocytes Relative: 8 %
Neutro Abs: 6.8 10*3/uL (ref 1.7–7.7)
Neutrophils Relative %: 76 %
Platelets: 217 10*3/uL (ref 150–400)
RBC: 3.82 MIL/uL — ABNORMAL LOW (ref 3.87–5.11)
RDW: 12.3 % (ref 11.5–15.5)
WBC: 9 10*3/uL (ref 4.0–10.5)
nRBC: 0 % (ref 0.0–0.2)

## 2020-07-20 LAB — COMPREHENSIVE METABOLIC PANEL
ALT: 13 U/L (ref 0–44)
AST: 16 U/L (ref 15–41)
Albumin: 3.4 g/dL — ABNORMAL LOW (ref 3.5–5.0)
Alkaline Phosphatase: 82 U/L (ref 38–126)
Anion gap: 11 (ref 5–15)
BUN: 12 mg/dL (ref 6–20)
CO2: 24 mmol/L (ref 22–32)
Calcium: 9.1 mg/dL (ref 8.9–10.3)
Chloride: 103 mmol/L (ref 98–111)
Creatinine, Ser: 1.09 mg/dL — ABNORMAL HIGH (ref 0.44–1.00)
GFR, Estimated: >60 mL/min (ref >60)
Glucose, Bld: 103 mg/dL — ABNORMAL HIGH (ref 70–99)
Potassium: 4 mmol/L (ref 3.5–5.1)
Sodium: 138 mmol/L (ref 135–145)
Total Bilirubin: 0.8 mg/dL (ref 0.3–1.2)
Total Protein: 6.4 g/dL — ABNORMAL LOW (ref 6.5–8.1)

## 2020-07-20 LAB — URINALYSIS, ROUTINE W REFLEX MICROSCOPIC
Bilirubin Urine: NEGATIVE
Glucose, UA: NEGATIVE mg/dL
Ketones, ur: NEGATIVE mg/dL
Nitrite: NEGATIVE
Protein, ur: NEGATIVE mg/dL
Specific Gravity, Urine: 1.009 (ref 1.005–1.030)
pH: 5 (ref 5.0–8.0)

## 2020-07-20 LAB — RESPIRATORY PANEL BY RT PCR (FLU A&B, COVID)
Influenza A by PCR: NEGATIVE
Influenza B by PCR: NEGATIVE
SARS Coronavirus 2 by RT PCR: NEGATIVE

## 2020-07-20 LAB — LIPASE, BLOOD: Lipase: 33 U/L (ref 11–51)

## 2020-07-20 SURGERY — CYSTOSCOPY, WITH RETROGRADE PYELOGRAM AND URETERAL STENT INSERTION
Anesthesia: General | Laterality: Left

## 2020-07-20 MED ORDER — MIDAZOLAM HCL 2 MG/2ML IJ SOLN
INTRAMUSCULAR | Status: AC
  Filled 2020-07-20: qty 2

## 2020-07-20 MED ORDER — FENTANYL CITRATE (PF) 100 MCG/2ML IJ SOLN
INTRAMUSCULAR | Status: AC
  Filled 2020-07-20: qty 2

## 2020-07-20 MED ORDER — SULFAMETHOXAZOLE-TRIMETHOPRIM 800-160 MG PO TABS: 1.0000 | ORAL_TABLET | Freq: Two times a day (BID) | ORAL | 0 refills | Status: AC

## 2020-07-20 MED ORDER — CHLORHEXIDINE GLUCONATE 0.12 % MT SOLN
15.0000 mL | OROMUCOSAL | Status: AC
  Administered 2020-07-20: 15 mL via OROMUCOSAL

## 2020-07-20 MED ORDER — OXYCODONE HCL 5 MG PO TABS: 5.0000 mg | ORAL_TABLET | Freq: Once | ORAL | Status: DC | PRN

## 2020-07-20 MED ORDER — ONDANSETRON HCL 4 MG/2ML IJ SOLN
INTRAMUSCULAR | Status: AC
  Administered 2020-07-20: 4 mg via INTRAVENOUS
  Filled 2020-07-20: qty 2

## 2020-07-20 MED ORDER — LACTATED RINGERS IV SOLN: INTRAVENOUS | Status: DC | PRN

## 2020-07-20 MED ORDER — IOHEXOL 300 MG/ML  SOLN
INTRAMUSCULAR | Status: DC | PRN
  Administered 2020-07-20: 5 mL

## 2020-07-20 MED ORDER — ONDANSETRON 4 MG PO TBDP
4.0000 mg | ORAL_TABLET | Freq: Once | ORAL | Status: AC | PRN
  Administered 2020-07-20: 4 mg via ORAL
  Filled 2020-07-20: qty 1

## 2020-07-20 MED ORDER — ONDANSETRON HCL 4 MG/2ML IJ SOLN: 4.0000 mg | Freq: Once | INTRAMUSCULAR | Status: AC

## 2020-07-20 MED ORDER — LIDOCAINE 2% (20 MG/ML) 5 ML SYRINGE
INTRAMUSCULAR | Status: DC | PRN
  Administered 2020-07-20: 60 mg via INTRAVENOUS

## 2020-07-20 MED ORDER — HYDROCODONE-ACETAMINOPHEN 5-325 MG PO TABS
1.0000 | ORAL_TABLET | Freq: Four times a day (QID) | ORAL | 0 refills | Status: DC | PRN
Start: 2020-07-20 — End: 2020-07-20

## 2020-07-20 MED ORDER — TAMSULOSIN HCL 0.4 MG PO CAPS: 0.4000 mg | ORAL_CAPSULE | Freq: Every day | ORAL | 0 refills | Status: AC

## 2020-07-20 MED ORDER — ONDANSETRON HCL 4 MG/2ML IJ SOLN
INTRAMUSCULAR | Status: DC | PRN
  Administered 2020-07-20 (×2): 4 mg via INTRAVENOUS

## 2020-07-20 MED ORDER — FENTANYL CITRATE (PF) 100 MCG/2ML IJ SOLN: 25.0000 ug | INTRAMUSCULAR | Status: DC | PRN

## 2020-07-20 MED ORDER — ACETAMINOPHEN 160 MG/5ML PO SOLN: 1000.0000 mg | Freq: Once | ORAL | Status: DC | PRN

## 2020-07-20 MED ORDER — DOCUSATE SODIUM 100 MG PO CAPS: 100.0000 mg | ORAL_CAPSULE | Freq: Every day | ORAL | 0 refills | Status: DC | PRN

## 2020-07-20 MED ORDER — MIDAZOLAM HCL 5 MG/5ML IJ SOLN
INTRAMUSCULAR | Status: DC | PRN
  Administered 2020-07-20 (×2): 1 mg via INTRAVENOUS

## 2020-07-20 MED ORDER — DEXAMETHASONE SODIUM PHOSPHATE 10 MG/ML IJ SOLN
INTRAMUSCULAR | Status: DC | PRN
  Administered 2020-07-20: 10 mg via INTRAVENOUS

## 2020-07-20 MED ORDER — SODIUM CHLORIDE 0.9 % IR SOLN
Status: DC | PRN
  Administered 2020-07-20: 3000 mL

## 2020-07-20 MED ORDER — KETOROLAC TROMETHAMINE 15 MG/ML IJ SOLN: 15.0000 mg | Freq: Once | INTRAMUSCULAR | Status: DC

## 2020-07-20 MED ORDER — LACTATED RINGERS IV SOLN: Freq: Once | INTRAVENOUS | Status: AC

## 2020-07-20 MED ORDER — PROPOFOL 10 MG/ML IV BOLUS
INTRAVENOUS | Status: DC | PRN
  Administered 2020-07-20: 40 mg via INTRAVENOUS
  Administered 2020-07-20: 160 mg via INTRAVENOUS

## 2020-07-20 MED ORDER — HYDROMORPHONE HCL 1 MG/ML IJ SOLN
0.5000 mg | Freq: Once | INTRAMUSCULAR | Status: AC
  Administered 2020-07-20: 0.5 mg via INTRAVENOUS
  Filled 2020-07-20: qty 1

## 2020-07-20 MED ORDER — PHENYLEPHRINE 40 MCG/ML (10ML) SYRINGE FOR IV PUSH (FOR BLOOD PRESSURE SUPPORT)
PREFILLED_SYRINGE | INTRAVENOUS | Status: DC | PRN
  Administered 2020-07-20: 80 ug via INTRAVENOUS
  Administered 2020-07-20: 120 ug via INTRAVENOUS

## 2020-07-20 MED ORDER — MORPHINE SULFATE (PF) 4 MG/ML IV SOLN
4.0000 mg | Freq: Once | INTRAVENOUS | Status: AC
  Administered 2020-07-20: 4 mg via INTRAVENOUS
  Filled 2020-07-20: qty 1

## 2020-07-20 MED ORDER — IOHEXOL 300 MG/ML  SOLN
100.0000 mL | Freq: Once | INTRAMUSCULAR | Status: AC | PRN
  Administered 2020-07-20: 100 mL via INTRAVENOUS

## 2020-07-20 MED ORDER — PROPOFOL 10 MG/ML IV BOLUS
INTRAVENOUS | Status: AC
  Filled 2020-07-20: qty 20

## 2020-07-20 MED ORDER — ACETAMINOPHEN 10 MG/ML IV SOLN: 1000.0000 mg | Freq: Once | INTRAVENOUS | Status: DC | PRN

## 2020-07-20 MED ORDER — SUCCINYLCHOLINE CHLORIDE 200 MG/10ML IV SOSY
PREFILLED_SYRINGE | INTRAVENOUS | Status: DC | PRN
  Administered 2020-07-20: 120 mg via INTRAVENOUS

## 2020-07-20 MED ORDER — GLYCOPYRROLATE 0.2 MG/ML IJ SOLN
INTRAMUSCULAR | Status: DC | PRN
  Administered 2020-07-20: .1 mg via INTRAVENOUS

## 2020-07-20 MED ORDER — AMISULPRIDE (ANTIEMETIC) 5 MG/2ML IV SOLN
INTRAVENOUS | Status: AC
  Filled 2020-07-20: qty 2

## 2020-07-20 MED ORDER — HYDROCODONE-ACETAMINOPHEN 5-325 MG PO TABS: 1.0000 | ORAL_TABLET | ORAL | 0 refills | Status: DC | PRN

## 2020-07-20 MED ORDER — KETOROLAC TROMETHAMINE 15 MG/ML IJ SOLN
15.0000 mg | Freq: Once | INTRAMUSCULAR | Status: AC
  Administered 2020-07-20: 15 mg via INTRAVENOUS
  Filled 2020-07-20: qty 1

## 2020-07-20 MED ORDER — OXYCODONE HCL 5 MG/5ML PO SOLN: 5.0000 mg | Freq: Once | ORAL | Status: DC | PRN

## 2020-07-20 MED ORDER — AMISULPRIDE (ANTIEMETIC) 5 MG/2ML IV SOLN
10.0000 mg | Freq: Once | INTRAVENOUS | Status: AC
  Administered 2020-07-20: 10 mg via INTRAVENOUS

## 2020-07-20 MED ORDER — ONDANSETRON HCL 4 MG PO TABS: 4.0000 mg | ORAL_TABLET | Freq: Three times a day (TID) | ORAL | 0 refills | Status: AC | PRN

## 2020-07-20 MED ORDER — ACETAMINOPHEN 500 MG PO TABS: 1000.0000 mg | ORAL_TABLET | Freq: Once | ORAL | Status: DC | PRN

## 2020-07-20 MED ORDER — GENTAMICIN SULFATE 40 MG/ML IJ SOLN
5.0000 mg/kg | Freq: Once | INTRAVENOUS | Status: AC
  Administered 2020-07-20: 290 mg via INTRAVENOUS
  Filled 2020-07-20: qty 7.25

## 2020-07-20 MED ORDER — FENTANYL CITRATE (PF) 100 MCG/2ML IJ SOLN
INTRAMUSCULAR | Status: DC | PRN
Start: 2020-07-20 — End: 2020-07-20
  Administered 2020-07-20 (×2): 50 ug via INTRAVENOUS

## 2020-07-20 SURGICAL SUPPLY — 15 items
BAG URO CATCHER STRL LF (MISCELLANEOUS) ×3 IMPLANT
CATH INTERMIT  6FR 70CM (CATHETERS) ×3 IMPLANT
CATH URET 5FR 28IN OPEN ENDED (CATHETERS) ×3 IMPLANT
CLOTH BEACON ORANGE TIMEOUT ST (SAFETY) ×3 IMPLANT
GLOVE BIOGEL M 7.0 STRL (GLOVE) ×3 IMPLANT
GOWN STRL REUS W/TWL LRG LVL3 (GOWN DISPOSABLE) ×6 IMPLANT
GUIDEWIRE STR DUAL SENSOR (WIRE) ×3 IMPLANT
GUIDEWIRE ZIPWRE .038 STRAIGHT (WIRE) IMPLANT
KIT TURNOVER KIT A (KITS) IMPLANT
MANIFOLD NEPTUNE II (INSTRUMENTS) ×3 IMPLANT
PACK CYSTO (CUSTOM PROCEDURE TRAY) ×3 IMPLANT
STENT URET 6FRX26 CONTOUR (STENTS) ×2 IMPLANT
TUBING CONNECTING 10 (TUBING) ×2 IMPLANT
TUBING CONNECTING 10' (TUBING) ×1
TUBING UROLOGY SET (TUBING) IMPLANT

## 2020-07-20 NOTE — Discharge Instructions (Signed)
   Activity:  You are encouraged to ambulate frequently (about every hour during waking hours) to help prevent blood clots from forming in your legs or lungs.  However, you should not engage in any heavy lifting (> 10-15 lbs), strenuous activity, or straining.   Diet: You should advance your diet as instructed by your physician.  It will be normal to have some bloating, nausea, and abdominal discomfort intermittently.   Prescriptions:  You will be provided a prescription for pain medication to take as needed.  If your pain is not severe enough to require the prescription pain medication, you may take extra strength Tylenol instead which will have less side effects.  You should also take a prescribed stool softener to avoid straining with bowel movements as the prescription pain medication may constipate you.   What to call us about: You should call the office [phone]) if you develop fever > 101 or develop persistent vomiting. Activity:  You are encouraged to ambulate frequently (about every hour during waking hours) to help prevent blood clots from forming in your legs or lungs.  However, you should not engage in any heavy lifting (> 10-15 lbs), strenuous activity, or straining.  You have a left ureteral stent in place. This will stay in place until treating your stone.

## 2020-07-20 NOTE — ED Notes (Signed)
Pt has lab results in her chart from yesterday afternoon.

## 2020-07-20 NOTE — ED Provider Notes (Signed)
[facility] EMERGENCY DEPARTMENT Provider Note   CSN: [id number] Arrival date & time: 07/20/20  0457     History Chief Complaint  Patient presents with  . Flank Pain    Sydney [name] is a 49 y.o. female presenting for evaluation of abdominal pain, flank pain, nausea.  Patient states that the past 2 weeks, she has not been feeling well.  This has been gradually worsening.  She states this feels different than her typical Crohn's flare.  She reports persistent nausea, but no vomiting.  She states her pain is mostly epigastric, but also radiates to her left flank.  She does have a history of kidney stones, but states this feels different.  She reports difficulty having a bowel movement, but then states she had a normal bowel movement yesterday, and is not abnormal for her not to have another 1.  There is no blood in her stool.  She saw her GI doctor yesterday, who scheduled the EGD and colonoscopy for next week, but as her pain is severe, she did not think she could make it.  She denies fevers, chills, chest pain, shortness of breath, urinary symptoms.  Having a bowel movement or urination does not change her pain or symptoms.  Eating does not make her symptoms better or worse, however she reports decreased appetite.  She states she feels bloated and swollen, states she has gained 5 pounds due to the swelling.  She is not on anything for Crohn's including prednisone or immunosuppression.  She has had a total hysterectomy and cholecystectomy in the past.  Additional history obtained from chart review.  History of ADHD, Crohn's, depression, GERD, kidney stone  HPI     Past Medical History:  Diagnosis Date  . ADD (attention deficit disorder)   . Crohn's colitis ([place])   . Depression with anxiety   . GERD (gastroesophageal reflux disease)   . History of iron deficiency anemia   . Nephrolithiasis   . Plantar wart of left foot 04/22/2018    Patient Active Problem List    Diagnosis Date Noted  . Smoker 04/22/2018  . Tobacco abuse counseling 04/22/2018  . Plantar wart of left foot 04/22/2018  . Crohn's disease of large intestine with complication ([place]) 11/27/2246  . ANXIETY DEPRESSION 12/17/2009  . DYSPHAGIA 12/17/2009  . GERD 01/21/2008    Past Surgical History:  Procedure Laterality Date  . ABDOMINAL HYSTERECTOMY  2011  . CESAREAN SECTION     x 3  . CHOLECYSTECTOMY    . COLONOSCOPY W/ BIOPSIES AND POLYPECTOMY  12/24/2009   pseudopolyps (hyperplastic), crohn's   . TONSILLECTOMY  1974  . UPPER GASTROINTESTINAL ENDOSCOPY  12/24/2009   w/bx, gastritis     OB History   No obstetric history on file.     Family History  Problem Relation Age of Onset  . Crohn's disease Mother   . Crohn's disease Other        grandmother  . Colon cancer Other        great grandmother    Social History   Tobacco Use  . Smoking status: Current Every Day Smoker    Types: Cigars  . Smokeless tobacco: Never Used  . Tobacco comment: 2 small cigars a day  Vaping Use  . Vaping Use: Former  Substance Use Topics  . Alcohol use: Yes    Comment: occ.  . Drug use: Yes    Types: Marijuana    Home Medications Prior to Admission medications  Medication Sig Start Date End Date Taking? Authorizing Provider  FLUoxetine (PROZAC) 40 MG capsule Take 40 mg by mouth at bedtime.    Yes [provider]  HYDROcodone-acetaminophen (NORCO/VICODIN) 5-325 MG tablet Take 1 tablet by mouth every 6 (six) hours as needed. 07/20/20   Mansi Tokar, PA-C  ondansetron (ZOFRAN) 4 MG tablet Take 1 tablet (4 mg total) by mouth every 8 (eight) hours as needed for nausea or vomiting. 07/20/20   Mark Benecke, PA-C  tamsulosin (FLOMAX) 0.4 MG CAPS capsule Take 1 capsule (0.4 mg total) by mouth daily. 07/20/20   Evans Levee, PA-C    Allergies    Metronidazole and Penicillins  Review of Systems   Review of Systems  Constitutional: Positive for unexpected weight  change.  Gastrointestinal: Positive for abdominal distention, abdominal pain and nausea.  Genitourinary: Positive for flank pain.  All other systems reviewed and are negative.   Physical Exam Updated Vital Signs BP (!) 98/57   Pulse 65   Temp 98.1 F (36.7 C) (Oral)   Resp 17   SpO2 99%   Physical Exam Vitals and nursing note reviewed.  Constitutional:      General: She is not in acute distress.    Appearance: She is well-developed.     Comments: Appears nontoxic  HENT:     Head: Normocephalic and atraumatic.  Eyes:     Conjunctiva/sclera: Conjunctivae normal.     Pupils: Pupils are equal, round, and reactive to light.  Cardiovascular:     Rate and Rhythm: Normal rate and regular rhythm.     Pulses: Normal pulses.  Pulmonary:     Effort: Pulmonary effort is normal. No respiratory distress.     Breath sounds: Normal breath sounds. No wheezing.  Abdominal:     General: There is no distension.     Palpations: Abdomen is soft. There is no mass.     Tenderness: There is abdominal tenderness. There is no guarding or rebound.     Comments: Diffuse TTP of the abdomen. ?  Mild distention.  No rigidity or guarding.  Negative rebound.  No peritonitis.  Left CVA tenderness.  No CVA tenderness on the right.  Musculoskeletal:        General: Normal range of motion.     Cervical back: Normal range of motion and neck supple.  Skin:    General: Skin is warm and dry.     Capillary Refill: Capillary refill takes less than 2 seconds.  Neurological:     Mental Status: She is alert and oriented to person, place, and time.     ED Results / Procedures / Treatments   Labs (all labs ordered are listed, but only abnormal results are displayed) Labs Reviewed  URINALYSIS, ROUTINE W REFLEX MICROSCOPIC - Abnormal; Notable for the following components:      Result Value   APPearance HAZY (*)    Hgb urine dipstick LARGE (*)    Leukocytes,Ua MODERATE (*)    Bacteria, UA RARE (*)    All other  components within normal limits  CBC WITH DIFFERENTIAL/PLATELET - Abnormal; Notable for the following components:   RBC 3.82 (*)    All other components within normal limits  COMPREHENSIVE METABOLIC PANEL - Abnormal; Notable for the following components:   Glucose, Bld 103 (*)    Creatinine, Ser 1.09 (*)    Total Protein 6.4 (*)    Albumin 3.4 (*)    All other components within normal limits  URINE CULTURE  LIPASE, BLOOD    EKG None  Radiology CT ABDOMEN PELVIS W CONTRAST  Result Date: 07/20/2020 CLINICAL DATA:  Abdominal distension Flank pain, kidney stone suspected Bowel obstruction suspected Abdominal pain, acute, nonlocalized EXAM: CT ABDOMEN AND PELVIS WITH CONTRAST TECHNIQUE: Multidetector CT imaging of the abdomen and pelvis was performed using the standard protocol following bolus administration of intravenous contrast. CONTRAST:  116m OMNIPAQUE IOHEXOL 300 MG/ML  SOLN COMPARISON:  07/12/2005 abdominal ultrasound. 09/12/2004 CT abdomen and pelvis. FINDINGS: Lower chest: Minimal subsegmental atelectasis. Partially imaged bilateral breast prostheses. Hepatobiliary: 3.6 x 3.3 cm right hepatic dome hemangioma (3:9). No biliary dilatation. Gallbladder is surgically absent. Pancreas: No focal lesions. No surrounding inflammation. Prominence of the pancreatic duct. Spleen: Unremarkable. Adrenals/Urinary Tract: Adrenal glands are unremarkable. The left kidney is asymmetrically edematous with a slightly delayed nephrogram. 0.5 cm left inferior pole calculus. Mild left hydroureteronephrosis with a distal left ureteral calculus measuring 0.5 cm in transaxial dimension (3:66). In craniocaudal dimension the calculus measures 1.3 cm (6:73). 3 mm right renal calculus (3:23). Bladder is unremarkable. Stomach/Bowel: Stomach is within normal limits. Appendix appears normal. No evidence of obstruction. No bowel wall thickening or inflammatory changes. No ascites. Vascular/Lymphatic: Vasculature is within  normal limits for patient's age. Prominent left retroperitoneal/perinephric nodes are reactive. Reproductive: Sequela of hysterectomy. Other: None. Musculoskeletal: No acute or significant osseous findings. IMPRESSION: Left nephrolithiasis and hydroureteronephrosis. Distal left ureteral calculus measuring 1.3 cm in craniocaudal dimension. Slightly delayed left nephrogram. 3.6 cm right hepatic dome hemangioma. Electronically Signed   By: CPrimitivo GauzeM.D.   On: 07/20/2020 08:57    Procedures Procedures (including critical care time)  Medications Ordered in ED Medications  ondansetron (ZOFRAN-ODT) disintegrating tablet 4 mg (4 mg Oral Given 07/20/20 0508)  morphine 4 MG/ML injection 4 mg (4 mg Intravenous Given 07/20/20 0659)  HYDROmorphone (DILAUDID) injection 0.5 mg (0.5 mg Intravenous Given 07/20/20 0829)  iohexol (OMNIPAQUE) 300 MG/ML solution 100 mL (100 mLs Intravenous Contrast Given 07/20/20 0828)  ketorolac (TORADOL) 15 MG/ML injection 15 mg (15 mg Intravenous Given 07/20/20 0957)    ED Course  I have reviewed the triage vital signs and the nursing notes.  Pertinent labs & imaging results that were available during my care of the patient were reviewed by me and considered in my medical decision making (see chart for details).    MDM Rules/Calculators/A&P                          Patient presenting for evaluation of abdominal pain, nausea, distention, left leg pain. On exam, patient appears nontoxic. She does have diffuse tenderness palpation of the abdomen as well as left CVA tenderness. Consider pancreatitis. Consider kidney stone. Consider Crohn's flare. Consider viral GI illness. Will obtain labs, CT abdomen pelvis, treat symptomatically, and reassess.  Labs interpreted by me, overall reassuring. No leukocytosis. Electrolytes stable. Patient does have large blood and moderate leuks in her urine, she will need CT for further evaluation.  CT shows left-sided kidney stone  with hydro-.  This is a large stone, 1.3 cm at its longest dimension.  Due to this, will consult with urology.  Discussed with Dr. GAbner Greenspanfrom urology who recommends attempted pain control in the ED.  If patient does well, plan for outpatient management.  If not, patient may need a stent today.  After morphine, Dilaudid, Toradol, patient reports improvement of pain.  She is agreeable to trying to manage her pain at home and outpatient follow-up.  She knows to return to the ER with worsening pain or signs of infection. Discussed importance of f/u with GI as well as scheduled for management of her crohn's.  At this time, patient appears safe for discharge.  Return precautions given.  Patient states she understands and agrees to plan.  Final Clinical Impression(s) / ED Diagnoses Final diagnoses:  Kidney stone    Rx / DC Orders ED Discharge Orders         Ordered    ondansetron (ZOFRAN) 4 MG tablet  Every 8 hours PRN        07/20/20 1032    HYDROcodone-acetaminophen (NORCO/VICODIN) 5-325 MG tablet  Every 6 hours PRN        07/20/20 1032    tamsulosin (FLOMAX) 0.4 MG CAPS capsule  Daily        07/20/20 1032           [place], [place], PA-C 07/20/20 1034    Carmin Muskrat, MD 07/23/20 1515

## 2020-07-20 NOTE — Anesthesia Preprocedure Evaluation (Addendum)
Anesthesia Evaluation  Patient identified by MRN, date of birth, ID band Patient awake    Reviewed: Allergy & Precautions, NPO status , Patient's Chart, lab work & pertinent test results  History of Anesthesia Complications Negative for: history of anesthetic complications  Airway Mallampati: II  TM Distance: >3 FB Neck ROM: Full    Dental  (+) Dental Advisory Given, Teeth Intact, Chipped,    Pulmonary Current Smoker and Patient abstained from smoking.,  Covid-19 Nucleic Acid Test Results Lab Results      Component                Value               Date                      SARSCOV2NAA              NEGATIVE            07/20/2020              breath sounds clear to auscultation       Cardiovascular negative cardio ROS   Rhythm:Regular     Neuro/Psych PSYCHIATRIC DISORDERS Anxiety Depression negative neurological ROS     GI/Hepatic Neg liver ROS, GERD  Controlled,  Endo/Other  negative endocrine ROS  Renal/GU ARFRenal diseaseLEFT URTERAL STONE Lab Results      Component                Value               Date                      CREATININE               1.09 (H)            07/20/2020           Lab Results      Component                Value               Date                      K                        4.0                 07/20/2020                Musculoskeletal negative musculoskeletal ROS (+)   Abdominal   Peds  Hematology negative hematology ROS (+) Lab Results      Component                Value               Date                      WBC                      9.0                 07/20/2020                HGB  12.5                07/20/2020                HCT                      38.2                07/20/2020                MCV                      100.0               07/20/2020                PLT                      217                 07/20/2020              Anesthesia Other  Findings   Reproductive/Obstetrics                            Anesthesia Physical Anesthesia Plan  ASA: II  Anesthesia Plan: General   Post-op Pain Management:    Induction: Intravenous, Rapid sequence and Cricoid pressure planned  PONV Risk Score and Plan: 2 and Dexamethasone and Ondansetron  Airway Management Planned: Oral ETT  Additional Equipment: None  Intra-op Plan:   Post-operative Plan: Extubation in OR  Informed Consent: I have reviewed the patients History and Physical, chart, labs and discussed the procedure including the risks, benefits and alternatives for the proposed anesthesia with the patient or authorized representative who has indicated his/her understanding and acceptance.     Dental advisory given  Plan Discussed with: CRNA and Surgeon  Anesthesia Plan Comments:         Anesthesia Quick Evaluation

## 2020-07-20 NOTE — ED Triage Notes (Addendum)
Pt reports hx of crohns disease, states she had been having abd pain, woke up and now has severe L flank pain, denies difficulty urinating but endorses constipation which she states she never has (has chronic diarrhea). Saw [facility] yesterday and was scheduled for colonoscopy and endoscopy at the end of November.

## 2020-07-20 NOTE — H&P (Signed)
Office Visit Report     07/20/2020   --------------------------------------------------------------------------------   Sydney [name]  MRN: [id number]  DOB: 01/24/1971, 49 year old Female  SSN:    PRIMARY CARE:    REFERRING:  Robert P. Vanita Panda, MD  PROVIDER:  Rexene Alberts, M.D.  LOCATION:  [facility] [phone]     --------------------------------------------------------------------------------   CC/HPI: Sydney [name] is a 49 year old female seen in consultation today for left flank pain.   She presented to [facility] emergency department today for 2-week history of left flank pain. Is been associated with nausea and some emesis. She denies fevers or chills. She has CT A/P on 07/20/2020 which revealed left nephrolithiasis and hydroureteronephrosis with a distal left ureteral calculus measuring 1.3 cm in craniocaudal dimension. She did have a slightly delayed left nephrogram. She was discharged home with pain control and represents her office today with persistent left flank pain. She denies fevers or chills. She does have a history of urolithiasis and has passed several stones in the past. She has never required surgical intervention for her stones. She denies dysuria or gross hematuria.       ALLERGIES: metronidazole penicillin    MEDICATIONS: Flomax 0.4 mg capsule  Hydrocodone-Acetaminophen 5 mg-325 mg tablet  Prozac 40 mg capsule  Zofran 4 mg tablet     GU PSH: None   NON-GU PSH: None   GU PMH: None   NON-GU PMH: Anxiety Crohn''s disease of large intestine with unspecified complications GERD Other dysphagia Plantar wart Tobacco abuse counseling    FAMILY HISTORY: None   SOCIAL HISTORY: Marital Status: Married Current Smoking Status: Patient smokes.   Tobacco Use Assessment Completed: Used Tobacco in last 30 days?    REVIEW OF SYSTEMS:    GU Review Female:   Patient denies frequent urination, hard to postpone urination, burning /pain with  urination, get up at night to urinate, leakage of urine, stream starts and stops, trouble starting your stream, have to strain to urinate, and being pregnant.  Gastrointestinal (Upper):   Patient denies nausea, vomiting, and indigestion/ heartburn.  Gastrointestinal (Lower):   Patient denies diarrhea and constipation.  Constitutional:   Patient denies fever, night sweats, weight loss, and fatigue.  Skin:   Patient denies skin rash/ lesion and itching.  Eyes:   Patient denies blurred vision and double vision.  Ears/ Nose/ Throat:   Patient denies sore throat and sinus problems.  Hematologic/Lymphatic:   Patient denies easy bruising and swollen glands.  Cardiovascular:   Patient denies leg swelling and chest pains.  Respiratory:   Patient denies cough and shortness of breath.  Endocrine:   Patient denies excessive thirst.  Musculoskeletal:   Patient denies back pain and joint pain.  Neurological:   Patient denies headaches and dizziness.  Psychologic:   Patient denies depression and anxiety.   VITAL SIGNS:      07/20/2020 01:51 PM  Weight 129 lb / 58.51 kg  Height 63 in / 160.02 cm  Pulse 56 /min  Temperature 96.9 F / 36.0 C  BMI 22.8 kg/m   MULTI-SYSTEM PHYSICAL EXAMINATION:    Constitutional: Well-nourished. No physical deformities. Normally developed. Good grooming.  Respiratory: No labored breathing, no use of accessory muscles.   Cardiovascular: Normal temperature, normal extremity pulses, no swelling, no varicosities.  Gastrointestinal: No mass, no tenderness, no rigidity, non obese abdomen. No CVA tenderness     Complexity of Data:  Source Of History:  Patient, Medical Record Summary  Lab Test Review:  BMP, CBC with Diff  Urine Test Review:   Urinalysis  X-Ray Review: C.T. Abdomen/Pelvis: Reviewed Films. Reviewed Report.     PROCEDURES:          Urinalysis w/Scope Dipstick Dipstick Cont'd Micro  Color: Yellow Bilirubin: Neg mg/dL WBC/hpf: 10 - 20/hpf  Appearance:  Cloudy Ketones: Neg mg/dL RBC/hpf: 10 - 20/hpf  Specific Gravity: 1.015 Blood: 3+ ery/uL Bacteria: Few (10-25/hpf)  pH: 6.5 Protein: 1+ mg/dL Cystals: NS (Not Seen)  Glucose: Neg mg/dL Urobilinogen: 0.2 mg/dL Casts: NS (Not Seen)    Nitrites: Neg Trichomonas: Not Present    Leukocyte Esterase: 2+ leu/uL Mucous: Not Present      Epithelial Cells: 6 - 10/hpf      Yeast: NS (Not Seen)      Sperm: Not Present    ASSESSMENT:      ICD-10 Details  1 GU:   Flank Pain - R10.84   2   Ureteral calculus - N20.1    PLAN:           Orders Labs CULTURE, URINE          Document Letter(s):  Created for Patient: Clinical Summary         Notes:   1. Left ureteral stone: CT A/P 07/20/2020 with 1.3 cm distal left ureteral stone associated with left hydroureteronephrosis. Scheduled for cystoscopy, left retrograde pyelogram, left ureteral stent placement today. Obtain urine culture today. We will schedule for cystoscopy, left retrograde pyelogram, left ureteroscopy with laser lithotripsy and stent exchange in the coming weeks for definitive treatment of her stone.   2. Left renal stones: Seen on CT A/P 07/20/2020. We will plan to treat concurrently with her left ureteral stone in a few weeks.   We discussed the options for management of ureteral stones, including trial of passage with medical expulsive therapy, ESWL, and ureteroscopy with laser lithotripsy. Smaller and more distal stones are best managed with trial of passage with medical expulsive therapy. Larger and more proximal stones are less likely to pass spontaneously, and are therefore best managed with either ESWL or ureteroscopy, with ureteroscopy offering higher stone free rates. The risks and benefits of each option were discussed.   Trial of passage: oral analgesics and medical expulsive therapy with an alpha blocker will be provided to assist with stone passage. The patient is instructed to return or go to ER for emergent intervention if  intolerable pain, intractable nausea/vomiting, or fever develops.   ESWL: risks and benefits of ESWL were outlined including infection, bleeding, pain, steinstrasse, kidney injury, need for ancillary treatments, and global anesthesia risks including but not limited to CVA, MI, DVT, PE, pneumonia, and death.   Ureteroscopy: risks and benefits of ureteroscopy were outlined, including infection, bleeding, pain, temporary ureteral stent and associated stent bother, ureteral injury, ureteral stricture, need for ancillary treatments, and global anesthesia risks including but not limited to CVA, MI, DVT, PE, pneumonia, and death.   CC: Carmin Muskrat, MD.   Urology Preoperative H&P   Chief Complaint: Left flank pain  History of Present Illness: ALIZZON DIOGUARDI is a 49 y.o. female with Left ureteral stone: CT A/P 07/20/2020 with 1.3 cm distal left ureteral stone associated with left hydroureteronephrosis. Scheduled for cystoscopy, left retrograde pyelogram, left ureteral stent placement today.  Past Medical History:  Diagnosis Date  . ADD (attention deficit disorder)   . Crohn's colitis ([place])   . Depression with anxiety   . GERD (gastroesophageal reflux disease)   . History of iron  deficiency anemia   . Nephrolithiasis   . Plantar wart of left foot 04/22/2018    Past Surgical History:  Procedure Laterality Date  . ABDOMINAL HYSTERECTOMY  2011  . CESAREAN SECTION     x 3  . CHOLECYSTECTOMY    . COLONOSCOPY W/ BIOPSIES AND POLYPECTOMY  12/24/2009   pseudopolyps (hyperplastic), crohn's   . TONSILLECTOMY  1974  . UPPER GASTROINTESTINAL ENDOSCOPY  12/24/2009   w/bx, gastritis    Allergies:  Allergies  Allergen Reactions  . Metronidazole Nausea And Vomiting and Other (See Comments)    "Made me deathly sick"  . Penicillins Other (See Comments)    from childhood- reaction not recalled    Family History  Problem Relation Age of Onset  . Crohn's disease Mother   . Crohn's disease Other         grandmother  . Colon cancer Other        great grandmother    Social History:  reports that she has been smoking cigars. She has never used smokeless tobacco. She reports current alcohol use. She reports current drug use. Drug: Marijuana.  ROS: A complete review of systems was performed.  All systems are negative except for pertinent findings as noted.  Physical Exam:  Vital signs in last 24 hours: Temp:  [98.1 F (36.7 C)-98.3 F (36.8 C)] 98.1 F (36.7 C) (11/12 1032) Pulse Rate:  [53-90] 65 (11/12 1030) Resp:  [10-22] 17 (11/12 1030) BP: (98-140)/(57-103) 98/57 (11/12 1030) SpO2:  [96 %-100 %] 99 % (11/12 1030) Constitutional:  Alert and oriented, No acute distress Cardiovascular: Regular rate and rhythm Respiratory: Normal respiratory effort, Lungs clear bilaterally GI: Abdomen is soft, nontender, nondistended, no abdominal masses GU: No CVA tenderness Lymphatic: No lymphadenopathy Neurologic: Grossly intact, no focal deficits Psychiatric: Normal mood and affect  Laboratory Data:  Recent Labs    07/19/20 1548 07/20/20 0640  WBC 6.7 9.0  HGB 13.4 12.5  HCT 39.0 38.2  PLT 246.0 217    Recent Labs    07/19/20 1548 07/20/20 0640  NA 138 138  K 4.0 4.0  CL 103 103  GLUCOSE 94 103*  BUN 17 12  CALCIUM 9.1 9.1  CREATININE 1.06 1.09*     Results for orders placed or performed during the hospital encounter of 07/20/20 (from the past 24 hour(s))  Urinalysis, Routine w reflex microscopic     Status: Abnormal   Collection Time: 07/20/20  6:40 AM  Result Value Ref Range   Color, Urine YELLOW YELLOW   APPearance HAZY (A) CLEAR   Specific Gravity, Urine 1.009 1.005 - 1.030   pH 5.0 5.0 - 8.0   Glucose, UA NEGATIVE NEGATIVE mg/dL   Hgb urine dipstick LARGE (A) NEGATIVE   Bilirubin Urine NEGATIVE NEGATIVE   Ketones, ur NEGATIVE NEGATIVE mg/dL   Protein, ur NEGATIVE NEGATIVE mg/dL   Nitrite NEGATIVE NEGATIVE   Leukocytes,Ua MODERATE (A) NEGATIVE   RBC / HPF  21-50 0 - 5 RBC/hpf   WBC, UA 21-50 0 - 5 WBC/hpf   Bacteria, UA RARE (A) NONE SEEN   Squamous Epithelial / LPF 6-10 0 - 5  CBC with Differential     Status: Abnormal   Collection Time: 07/20/20  6:40 AM  Result Value Ref Range   WBC 9.0 4.0 - 10.5 K/uL   RBC 3.82 (L) 3.87 - 5.11 MIL/uL   Hemoglobin 12.5 12.0 - 15.0 g/dL   HCT 38.2 36 - 46 %   MCV 100.0 80.0 -  100.0 fL   MCH 32.7 26.0 - 34.0 pg   MCHC 32.7 30.0 - 36.0 g/dL   RDW 12.3 11.5 - 15.5 %   Platelets 217 150 - 400 K/uL   nRBC 0.0 0.0 - 0.2 %   Neutrophils Relative % 76 %   Neutro Abs 6.8 1.7 - 7.7 K/uL   Lymphocytes Relative 14 %   Lymphs Abs 1.2 0.7 - 4.0 K/uL   Monocytes Relative 8 %   Monocytes Absolute 0.7 0.1 - 1.0 K/uL   Eosinophils Relative 1 %   Eosinophils Absolute 0.1 0.0 - 0.5 K/uL   Basophils Relative 1 %   Basophils Absolute 0.1 0.0 - 0.1 K/uL   Immature Granulocytes 0 %   Abs Immature Granulocytes 0.03 0.00 - 0.07 K/uL  Comprehensive metabolic panel     Status: Abnormal   Collection Time: 07/20/20  6:40 AM  Result Value Ref Range   Sodium 138 135 - 145 mmol/L   Potassium 4.0 3.5 - 5.1 mmol/L   Chloride 103 98 - 111 mmol/L   CO2 24 22 - 32 mmol/L   Glucose, Bld 103 (H) 70 - 99 mg/dL   BUN 12 6 - 20 mg/dL   Creatinine, Ser 1.09 (H) 0.44 - 1.00 mg/dL   Calcium 9.1 8.9 - 10.3 mg/dL   Total Protein 6.4 (L) 6.5 - 8.1 g/dL   Albumin 3.4 (L) 3.5 - 5.0 g/dL   AST 16 15 - 41 U/L   ALT 13 0 - 44 U/L   Alkaline Phosphatase 82 38 - 126 U/L   Total Bilirubin 0.8 0.3 - 1.2 mg/dL   GFR, Estimated >60 >60 mL/min   Anion gap 11 5 - 15  Lipase, blood     Status: None   Collection Time: 07/20/20  6:40 AM  Result Value Ref Range   Lipase 33 11 - 51 U/L   No results found for this or any previous visit (from the past 240 hour(s)).  Renal Function: Recent Labs    07/19/20 1548 07/20/20 0640  CREATININE 1.06 1.09*   Estimated Creatinine Clearance: 50.5 mL/min (A) (by C-G formula based on SCr of 1.09 mg/dL  (H)).  Radiologic Imaging: CT ABDOMEN PELVIS W CONTRAST  Result Date: 07/20/2020 CLINICAL DATA:  Abdominal distension Flank pain, kidney stone suspected Bowel obstruction suspected Abdominal pain, acute, nonlocalized EXAM: CT ABDOMEN AND PELVIS WITH CONTRAST TECHNIQUE: Multidetector CT imaging of the abdomen and pelvis was performed using the standard protocol following bolus administration of intravenous contrast. CONTRAST:  125m OMNIPAQUE IOHEXOL 300 MG/ML  SOLN COMPARISON:  07/12/2005 abdominal ultrasound. 09/12/2004 CT abdomen and pelvis. FINDINGS: Lower chest: Minimal subsegmental atelectasis. Partially imaged bilateral breast prostheses. Hepatobiliary: 3.6 x 3.3 cm right hepatic dome hemangioma (3:9). No biliary dilatation. Gallbladder is surgically absent. Pancreas: No focal lesions. No surrounding inflammation. Prominence of the pancreatic duct. Spleen: Unremarkable. Adrenals/Urinary Tract: Adrenal glands are unremarkable. The left kidney is asymmetrically edematous with a slightly delayed nephrogram. 0.5 cm left inferior pole calculus. Mild left hydroureteronephrosis with a distal left ureteral calculus measuring 0.5 cm in transaxial dimension (3:66). In craniocaudal dimension the calculus measures 1.3 cm (6:73). 3 mm right renal calculus (3:23). Bladder is unremarkable. Stomach/Bowel: Stomach is within normal limits. Appendix appears normal. No evidence of obstruction. No bowel wall thickening or inflammatory changes. No ascites. Vascular/Lymphatic: Vasculature is within normal limits for patient's age. Prominent left retroperitoneal/perinephric nodes are reactive. Reproductive: Sequela of hysterectomy. Other: None. Musculoskeletal: No acute or significant osseous findings. IMPRESSION:  Left nephrolithiasis and hydroureteronephrosis. Distal left ureteral calculus measuring 1.3 cm in craniocaudal dimension. Slightly delayed left nephrogram. 3.6 cm right hepatic dome hemangioma. Electronically Signed    By: Primitivo Gauze M.D.   On: 07/20/2020 08:57    I independently reviewed the above imaging studies.  Assessment and Plan JORGIA MANTHEI is a 49 y.o. female with Left ureteral stone: CT A/P 07/20/2020 with 1.3 cm distal left ureteral stone associated with left hydroureteronephrosis. Scheduled for cystoscopy, left retrograde pyelogram, left ureteral stent placement today.  -The risks, benefits and alternatives of cystoscopy with left JJ stent placement was discussed with the patient.  Risks include, but are not limited to: bleeding, urinary tract infection, ureteral injury, ureteral stricture disease, chronic pain, urinary symptoms, bladder injury, stent migration, the need for nephrostomy tube placement, MI, CVA, DVT, PE and the inherent risks with general anesthesia.  The patient voices understanding and wishes to proceed.   Matt R. Aleksa Collinsworth MD 07/20/2020, 4:00 PM  [facility] Pager: [phone]): [phone]

## 2020-07-20 NOTE — Op Note (Signed)
Operative Note  Preoperative diagnosis:  1.  Left ureteral stone   Postoperative diagnosis: 1.   Left ureteral stone   Procedure(s): 1.  Cystoscopy 2. Left retrograde pyelogram with interpretation 3. Left ureteral stent placement  Surgeon: Rexene Alberts, MD  Assistants:  None  Anesthesia:  General  Complications:  None  EBL:  minimal  Specimens: 1.  ID Type Source Tests Collected by Time Destination  A : Left renal pelvis Urine Urine, Cystoscope URINE CULTURE Janith Lima, MD 07/20/2020 1818     Drains/Catheters: 1.  6 French by 26 cm left ureteral stent  Intraoperative findings:   1. Cystoscopy demonstrated no bladder lesions, bladder stones or other pathology.  Her bilateral ureteral orifices were in their normal anatomic position.  Left retrograde pyelogram demonstrated left hydroureteronephrosis.  Given to see large calculus in the distal left ureter.  Successful placement of left 6 French by 26 cm ureteral stent.  Indication:  [name] is a 48 y.o. female with an obstructing left ureteral stone. After reviewing the management options for treatment, she elected to proceed with the above surgical procedure(s). We have discussed the potential benefits and risks of the procedure, side effects of the proposed treatment, the likelihood of the patient achieving the goals of the procedure, and any potential problems that might occur during the procedure or recuperation. Informed consent has been obtained.  Description of procedure: The patient was taken to the operating room and general anesthesia was induced.  The patient was placed in the dorsal lithotomy position, prepped and draped in the usual sterile fashion, and preoperative antibiotics were administered. A preoperative time-out was performed.   Cystourethroscopy was performed.  The patient's urethra was examined and was normal. The bladder was then systematically examined in its entirety. There was no evidence for any  bladder tumors, stones, or other mucosal pathology.    Attention then turned to the left ureteral orifice and a ureteral catheter was used to intubate the ureteral orifice.  A 0.038 sensor wire was then passed up into the left renal pelvis.  A 5 French opening catheter was advanced to the renal pelvis.  The wire was removed.  Will obtain left renal pelvis urine for culture. Omnipaque contrast was injected through the ureteral catheter and a retrograde pyelogram was performed with findings as dictated above.  A 0.38 sensor guidewire was then advanced up the left ureter into the renal pelvis under fluoroscopic guidance.  The wire was then backloaded through the cystoscope and a ureteral stent was advance over the wire using Seldinger technique.  The stent was positioned appropriately under fluoroscopic and cystoscopic guidance.  The wire was then removed with an adequate stent curl noted in the renal pelvis as well as in the bladder.  The bladder was then emptied and the procedure ended.  The patient appeared to tolerate the procedure well and without complications.  The patient was able to be awakened and transferred to the recovery unit in satisfactory condition.   Plan:  Discharge home. F/u for L URS/LL for definitive treatment of left ureteral and renal stones.  Matt R. [facility]  Pager: [phone]

## 2020-07-20 NOTE — Anesthesia Procedure Notes (Signed)
Procedure Name: Intubation Date/Time: 07/20/2020 6:07 PM Performed by: Cynda Familia, CRNA Pre-anesthesia Checklist: Patient identified, Emergency Drugs available, Suction available and Patient being monitored Patient Re-evaluated:Patient Re-evaluated prior to induction Oxygen Delivery Method: Circle System Utilized Preoxygenation: Pre-oxygenation with 100% oxygen Induction Type: IV induction, Rapid sequence and Cricoid Pressure applied Ventilation: Mask ventilation without difficulty Laryngoscope Size: Miller and 2 Grade View: Grade I Tube type: Oral Tube size: 7.0 mm Number of attempts: 1 Airway Equipment and Method: Stylet and Oral airway Placement Confirmation: ETT inserted through vocal cords under direct vision,  positive ETCO2 and breath sounds checked- equal and bilateral Secured at: 22 cm Tube secured with: Tape Dental Injury: Teeth and Oropharynx as per pre-operative assessment  Comments: Smooth IV induction-- intubation AM CRNA atraumatic-- teeth and mouth as preop-- chipped front teeth and left upper lateral broken off-- all unchanged after laryngoscopy-- bilat Bs moser

## 2020-07-20 NOTE — Discharge Instructions (Signed)
Make sure you are staying well-hydrated water. Take Flomax daily. Use Tylenol or ibuprofen as needed for mild to moderate pain.  Use Norco as needed for severe breakthrough pain.  Have caution, this make you tired or groggy.  Do not drive or operate heavy machinery while taking this medicine. Use Zofran as needed for nausea or vomiting. Call the urologist listed below to set up an outpatient appointment for further management of your kidney stone.  This will likely not pass on its own due to its size. I recommend you continue to follow-up with your GI doctor at your scheduled EGD/colonoscopy for further evaluation of her Crohn's. Return to the emergency room if you develop fevers, persistent vomiting, severe worsening pain, inability urinate, any new, worsening, or concerning symptoms.

## 2020-07-20 NOTE — Transfer of Care (Addendum)
Immediate Anesthesia Transfer of Care Note  Patient: Sydney [name]  Procedure(s) Performed: CYSTOSCOPY WITH RETROGRADE PYELOGRAM/URETERAL STENT PLACEMENT (Left )  Patient Location: PACU  Anesthesia Type:General  Level of Consciousness: awake, alert , oriented and patient cooperative  Airway & Oxygen Therapy: Patient Spontanous Breathing and Patient connected to face mask oxygen  Post-op Assessment: Report given to RN, Post -op Vital signs reviewed and stable and Patient moving all extremities X 4  Post vital signs: stable  Last Vitals:  Vitals Value Taken Time  BP 124/80 07/20/20 1900  Temp 36.7 C 07/20/20 1847  Pulse 61 07/20/20 1913  Resp 21 07/20/20 1913  SpO2 100 % 07/20/20 1913  Vitals shown include unvalidated device data.  Last Pain:  Vitals:   07/20/20 1900  PainSc: 0-No pain         Complications: No complications documented.  Pt very anxious but much improved with versed and barhemsys

## 2020-07-21 LAB — URINE CULTURE

## 2020-07-23 ENCOUNTER — Encounter (HOSPITAL_COMMUNITY): Payer: Self-pay | Admitting: Urology

## 2020-07-23 LAB — URINE CULTURE: Culture: 50000 — AB

## 2020-07-23 NOTE — Anesthesia Postprocedure Evaluation (Signed)
Anesthesia Post Note  Patient: Sydney [name]  Procedure(s) Performed: CYSTOSCOPY WITH RETROGRADE PYELOGRAM/URETERAL STENT PLACEMENT (Left )     Patient location during evaluation: PACU Anesthesia Type: General Level of consciousness: awake and alert Pain management: pain level controlled Vital Signs Assessment: post-procedure vital signs reviewed and stable Respiratory status: spontaneous breathing, nonlabored ventilation, respiratory function stable and patient connected to nasal cannula oxygen Cardiovascular status: blood pressure returned to baseline and stable Postop Assessment: no apparent nausea or vomiting Anesthetic complications: no   No complications documented.  Last Vitals:  Vitals:   07/20/20 1930 07/20/20 2000  BP: 128/86 115/80  Pulse: 60 64  Resp: 13 16  Temp:  36.8 C  SpO2: 100% 100%    Last Pain:  Vitals:   07/20/20 2000  PainSc: 0-No pain                 Caprice Mccaffrey

## 2020-07-25 ENCOUNTER — Encounter: Payer: Self-pay | Admitting: Internal Medicine

## 2020-07-26 ENCOUNTER — Other Ambulatory Visit: Payer: Self-pay | Admitting: Internal Medicine

## 2020-07-27 LAB — SARS CORONAVIRUS 2 (TAT 6-24 HRS): SARS Coronavirus 2: NEGATIVE

## 2020-07-30 ENCOUNTER — Encounter: Payer: Self-pay | Admitting: Internal Medicine

## 2020-07-30 ENCOUNTER — Other Ambulatory Visit: Payer: Self-pay

## 2020-07-30 ENCOUNTER — Other Ambulatory Visit: Payer: Self-pay | Admitting: Urology

## 2020-07-30 ENCOUNTER — Other Ambulatory Visit: Payer: Self-pay | Admitting: Internal Medicine

## 2020-07-30 ENCOUNTER — Ambulatory Visit (AMBULATORY_SURGERY_CENTER): Payer: 59 | Admitting: Internal Medicine

## 2020-07-30 VITALS — BP 105/71 | HR 63 | Temp 99.0°F | Resp 13 | Ht 63.0 in | Wt 129.0 lb

## 2020-07-30 DIAGNOSIS — K50118 Crohn's disease of large intestine with other complication: Secondary | ICD-10-CM | POA: Diagnosis not present

## 2020-07-30 DIAGNOSIS — K317 Polyp of stomach and duodenum: Secondary | ICD-10-CM

## 2020-07-30 DIAGNOSIS — K3189 Other diseases of stomach and duodenum: Secondary | ICD-10-CM

## 2020-07-30 DIAGNOSIS — K222 Esophageal obstruction: Secondary | ICD-10-CM

## 2020-07-30 DIAGNOSIS — K2900 Acute gastritis without bleeding: Secondary | ICD-10-CM

## 2020-07-30 DIAGNOSIS — R1013 Epigastric pain: Secondary | ICD-10-CM

## 2020-07-30 DIAGNOSIS — K529 Noninfective gastroenteritis and colitis, unspecified: Secondary | ICD-10-CM | POA: Diagnosis not present

## 2020-07-30 DIAGNOSIS — R1310 Dysphagia, unspecified: Secondary | ICD-10-CM | POA: Diagnosis not present

## 2020-07-30 DIAGNOSIS — K449 Diaphragmatic hernia without obstruction or gangrene: Secondary | ICD-10-CM | POA: Diagnosis not present

## 2020-07-30 MED ORDER — SODIUM CHLORIDE 0.9 % IV SOLN: 500.0000 mL | Freq: Once | INTRAVENOUS | Status: DC

## 2020-07-30 NOTE — Progress Notes (Signed)
Vs by jb

## 2020-07-30 NOTE — Op Note (Signed)
[place] Patient Name: Sydney [name] Procedure Date: 07/30/2020 1:34 PM MRN: [id number] Endoscopist: Gatha Mayer , MD Age: 48 Referring MD:  Date of Birth: [birth year] Gender: Female Account #: [id number] Procedure:                Colonoscopy Indications:              High risk colon cancer surveillance: Crohn's                            colitis of 8 (or more) years duration with                            one-third (or more) of the colon involved Medicines:                Propofol per Anesthesia, Monitored Anesthesia Care Procedure:                Pre-Anesthesia Assessment:                           - Prior to the procedure, a History and Physical                            was performed, and patient medications and                            allergies were reviewed. The patient's tolerance of                            previous anesthesia was also reviewed. The risks                            and benefits of the procedure and the sedation                            options and risks were discussed with the patient.                            All questions were answered, and informed consent                            was obtained. Prior Anticoagulants: The patient has                            taken no previous anticoagulant or antiplatelet                            agents. ASA Grade Assessment: II - A patient with                            mild systemic disease. After reviewing the risks                            and benefits, the patient was deemed in  satisfactory condition to undergo the procedure.                           After obtaining informed consent, the colonoscope                            was passed under direct vision. Throughout the                            procedure, the patient's blood pressure, pulse, and                            oxygen saturations were monitored continuously. The                            0022  [employer] was introduced through the                            anus and advanced to the the terminal ileum, with                            identification of the appendiceal orifice and IC                            valve. The colonoscopy was performed without                            difficulty. The patient tolerated the procedure                            well. The quality of the bowel preparation was                            good. The terminal ileum, ileocecal valve,                            appendiceal orifice, and rectum were photographed.                            The bowel preparation used was Miralax via split                            dose instruction. Scope In: 2:01:00 PM Scope Out: 2:19:41 PM Scope Withdrawal Time: 0 hours 15 minutes 47 seconds  Total Procedure Duration: 0 hours 18 minutes 41 seconds  Findings:                 The perianal and digital rectal examinations were                            normal.                           The terminal ileum appeared normal. Biopsies were  taken with a cold forceps for histology.                            Verification of patient identification for the                            specimen was done. Estimated blood loss was minimal.                           The mucosa vascular pattern in the entire colon was                            diffusely decreased. Some patchy erythema. There                            were associated scattered pseudopolyps. Two                            biopsies were taken every 10 cm with a cold forceps                            from the rectum and entire colon for Crohn's                            disease surveillance. These biopsy specimens from                            the cecum, ascending colon, transverse colon,                            descending colon, sigmoid colon and rectum were                            sent to Pathology. Verification of patient                             identification for the specimen was done. Estimated                            blood loss was minimal.                           Internal hemorrhoids were found. Complications:            No immediate complications. Estimated Blood Loss:     Estimated blood loss was minimal. Impression:               - The examined portion of the ileum was normal.                            Biopsied.                           - Decreased mucosa vascular pattern in the entire  examined colon, patchy erythema and scattered                            pseudopolyps.. Biopsied.                           - Internal hemorrhoids. Recommendation:           - Patient has a contact number available for                            emergencies. The signs and symptoms of potential                            delayed complications were discussed with the                            patient. Return to normal activities tomorrow.                            Written discharge instructions were provided to the                            patient.                           - Resume previous diet.                           - Continue present medications.                           - Repeat colonoscopy is recommended. The                            colonoscopy date will be determined after pathology                            results from today's exam become available for                            review. Gatha Mayer, MD 07/30/2020 2:36:19 PM This report has been signed electronically.

## 2020-07-30 NOTE — Op Note (Signed)
[place] Patient Name: Naseem Adler Procedure Date: 07/30/2020 1:35 PM MRN: [id number] Endoscopist: Gatha Mayer , MD Age: 49 Referring MD:  Date of Birth: 1971/07/14 Gender: Female Account #: [id number] Procedure:                Upper GI endoscopy Indications:              Epigastric abdominal pain, Dysphagia Medicines:                Propofol per Anesthesia, Monitored Anesthesia Care Procedure:                Pre-Anesthesia Assessment:                           - Prior to the procedure, a History and Physical                            was performed, and patient medications and                            allergies were reviewed. The patient's tolerance of                            previous anesthesia was also reviewed. The risks                            and benefits of the procedure and the sedation                            options and risks were discussed with the patient.                            All questions were answered, and informed consent                            was obtained. Prior Anticoagulants: The patient has                            taken no previous anticoagulant or antiplatelet                            agents. ASA Grade Assessment: II - A patient with                            mild systemic disease. After reviewing the risks                            and benefits, the patient was deemed in                            satisfactory condition to undergo the procedure.                           After obtaining informed consent, the endoscope was  passed under direct vision. Throughout the                            procedure, the patient's blood pressure, pulse, and                            oxygen saturations were monitored continuously. The                            Endoscope was introduced through the mouth, and                            advanced to the second part of duodenum. The upper                             GI endoscopy was accomplished without difficulty.                            The patient tolerated the procedure well. Scope In: Scope Out: Findings:                 A mild Schatzki ring was found at the                            gastroesophageal junction. A TTS dilator was passed                            through the scope. Dilation with an 18-19-20 mm                            balloon dilator was performed to 20 mm. The                            dilation site was examined and showed no change.                            Estimated blood loss: none.                           A 2 cm hiatal hernia was present.                           The gastroesophageal flap valve was visualized                            endoscopically and classified as Hill Grade IV (no                            fold, wide open lumen, hiatal hernia present).                           Patchy mild inflammation characterized by erythema  and granularity was found in the gastric antrum.                            Biopsies were taken with a cold forceps for                            histology. Verification of patient identification                            for the specimen was done. Estimated blood loss was                            minimal.                           The exam was otherwise without abnormality.                           The cardia and gastric fundus were otherwise normal                            on retroflexion. Complications:            No immediate complications. Estimated Blood Loss:     Estimated blood loss was minimal. Impression:               - Mild Schatzki ring. Dilated.                           - 2 cm hiatal hernia.                           - Gastroesophageal flap valve classified as Hill                            Grade IV (no fold, wide open lumen, hiatal hernia                            present).                           - Gastritis. Biopsied.                            - The examination was otherwise normal. Has been                            found to have an obstructing kidney stone which is                            likely cause of pain Recommendation:           - Patient has a contact number available for                            emergencies. The signs and symptoms of potential  delayed complications were discussed with the                            patient. Return to normal activities tomorrow.                            Written discharge instructions were provided to the                            patient.                           - Resume previous diet.                           - Continue present medications.                           - Await pathology results.                           - See the other procedure note for documentation of                            additional recommendations. Gatha Mayer, MD 07/30/2020 2:30:48 PM This report has been signed electronically.

## 2020-07-30 NOTE — Progress Notes (Signed)
A and O x3. Report to RN. Tolerated MAC anesthesia well.Teeth unchanged after procedure.

## 2020-07-30 NOTE — Patient Instructions (Addendum)
On the upper endoscopy there was a partial narrowing where the esophagus and stomach me and I opened that up.  You have a small hiatal hernia and also gastritis or inflammation in the stomach.  I took biopsies of that.  The colonoscopy did not show signs of active inflammation.  Mostly it looks like some scarring to me.  I took numerous biopsies from the end of the small intestine and the colon.  As we discussed I think your recent problems were related to your kidney stone problem.  I wish you good luck with your treatment next Monday.  When I get the pathology results in we will call you back with results and plans versus sending a letter.  I appreciate the opportunity to care for you. Gatha Mayer, MD, FACG  YOU HAD AN ENDOSCOPIC PROCEDURE TODAY AT [facility]:   Refer to the procedure report that was given to you for any specific questions about what was found during the examination.  If the procedure report does not answer your questions, please call your gastroenterologist to clarify.  If you requested that your care partner not be given the details of your procedure findings, then the procedure report has been included in a sealed envelope for you to review at your convenience later.  YOU SHOULD EXPECT: Some feelings of bloating in the abdomen. Passage of more gas than usual.  Walking can help get rid of the air that was put into your GI tract during the procedure and reduce the bloating. If you had a lower endoscopy (such as a colonoscopy or flexible sigmoidoscopy) you may notice spotting of blood in your stool or on the toilet paper. If you underwent a bowel prep for your procedure, you may not have a normal bowel movement for a few days.  Please Note:  You might notice some irritation and congestion in your nose or some drainage.  This is from the oxygen used during your procedure.  There is no need for concern and it should clear up in a day or so.  SYMPTOMS TO REPORT  IMMEDIATELY:   Following lower endoscopy (colonoscopy or flexible sigmoidoscopy):  Excessive amounts of blood in the stool  Significant tenderness or worsening of abdominal pains  Swelling of the abdomen that is new, acute  Fever of 100F or higher   Following upper endoscopy (EGD)  Vomiting of blood or coffee ground material  New chest pain or pain under the shoulder blades  Painful or persistently difficult swallowing  New shortness of breath  Fever of 100F or higher  Black, tarry-looking stools  For urgent or emergent issues, a gastroenterologist can be reached at any hour by calling [phone]. Do not use MyChart messaging for urgent concerns.    DIET:  We do recommend a small meal at first, but then you may proceed to your regular diet.  Drink plenty of fluids but you should avoid alcoholic beverages for 24 hours.  ACTIVITY:  You should plan to take it easy for the rest of today and you should NOT DRIVE or use heavy machinery until tomorrow (because of the sedation medicines used during the test).    FOLLOW UP: Our staff will call the number listed on your records 48-72 hours following your procedure to check on you and address any questions or concerns that you may have regarding the information given to you following your procedure. If we do not reach you, we will leave a message.  We  will attempt to reach you two times.  During this call, we will ask if you have developed any symptoms of COVID 19. If you develop any symptoms (ie: fever, flu-like symptoms, shortness of breath, cough etc.) before then, please call [phone].  If you test positive for Covid 19 in the 2 weeks post procedure, please call and report this information to [place].    If any biopsies were taken you will be contacted by phone or by letter within the next 1-3 weeks.  Please call us at [phone] if you have not heard about the biopsies in 3 weeks.    SIGNATURES/CONFIDENTIALITY: You and/or your  care partner have signed paperwork which will be entered into your electronic medical record.  These signatures attest to the fact that that the information above on your After Visit Summary has been reviewed and is understood.  Full responsibility of the confidentiality of this discharge information lies with you and/or your care-partner.

## 2020-07-30 NOTE — Progress Notes (Signed)
Called to room to assist during endoscopic procedure.  Patient ID and intended procedure confirmed with present staff. Received instructions for my participation in the procedure from the performing physician.  

## 2020-07-30 NOTE — Progress Notes (Signed)
Lidocaine buffer  robinol antisialogogue

## 2020-07-31 ENCOUNTER — Telehealth: Payer: Self-pay | Admitting: *Deleted

## 2020-07-31 NOTE — Patient Instructions (Addendum)
DUE TO COVID-19 ONLY ONE VISITOR IS ALLOWED TO COME WITH YOU AND STAY IN THE WAITING ROOM ONLY DURING PRE OP AND PROCEDURE.   IF YOU WILL BE ADMITTED INTO THE HOSPITAL YOU ARE ALLOWED ONE SUPPORT PERSON DURING VISITATION HOURS ONLY (10AM -8PM)   . The support person may change daily. . The support person must pass our screening, gel in and out, and wear a mask at all times, including in the patient's room. . Patients must also wear a mask when staff or their support person are in the room.   COVID SWAB TESTING MUST BE COMPLETED ON:   Friday, 08-03-20 @ 1:00 PM   [address] [place], [place] [address]  (Must self quarantine after testing. Follow instructions on handout.)        Your procedure is scheduled on:  Monday, 08-06-20   Report to [facility] Main  Entrance    Report to admitting at 2:00 PM   Call this number if you have problems the morning of surgery [phone]   Do not eat food :After Midnight.   May have liquids until 1:00 PM day of surgery  CLEAR LIQUID DIET  Foods Allowed                                                                     Foods Excluded  Water, Black Coffee and tea, regular and decaf               liquids that you cannot  Plain Jell-O in any flavor  (No red)                                      see through such as: Fruit ices (not with fruit pulp)                                      milk, soups, orange juice              Iced Popsicles (No red)                                      All solid food                                   Apple juices Sports drinks like Gatorade (No red) Lightly seasoned clear broth or consume(fat free) Sugar, honey syrup    Oral Hygiene is also important to reduce your risk of infection.                                    Remember - BRUSH YOUR TEETH THE MORNING OF SURGERY WITH YOUR REGULAR TOOTHPASTE   Do NOT smoke after Midnight   Take these medicines the morning of surgery with A SIP OF WATER: None  You may not have any metal on your body including hair pins, jewelry, and body piercings             Do not wear make-up, lotions, powders, perfumes/cologne, or deodorant             Do not wear nail polish.  Do not shave  48 hours prior to surgery.    Do not bring valuables to the hospital. [place].   Contacts, dentures or bridgework may not be worn into surgery.     Patients discharged the day of surgery will not be allowed to drive home.               Please read over the following fact sheets you were given: IF YOU HAVE QUESTIONS ABOUT YOUR PRE OP INSTRUCTIONS PLEASE CALL [phone]   [facility] - Preparing for Surgery Before surgery, you can play an important role.  Because skin is not sterile, your skin needs to be as free of germs as possible.  You can reduce the number of germs on your skin by washing with CHG (chlorahexidine gluconate) soap before surgery.  CHG is an antiseptic cleaner which kills germs and bonds with the skin to continue killing germs even after washing. Please DO NOT use if you have an allergy to CHG or antibacterial soaps.  If your skin becomes reddened/irritated stop using the CHG and inform your nurse when you arrive at Short Stay. Do not shave (including legs and underarms) for at least 48 hours prior to the first CHG shower.  You may shave your face/neck.  Please follow these instructions carefully:  1.  Shower with CHG Soap the night before surgery and the  morning of surgery.  2.  If you choose to wash your hair, wash your hair first as usual with your normal  shampoo.  3.  After you shampoo, rinse your hair and body thoroughly to remove the shampoo.                             4.  Use CHG as you would any other liquid soap.  You can apply chg directly to the skin and wash.  Gently with a scrungie or clean washcloth.  5.  Apply the CHG Soap to your body ONLY FROM THE NECK DOWN.   Do    not use on face/ open                           Wound or open sores. Avoid contact with eyes, ears mouth and   genitals (private parts).                       Wash face,  Genitals (private parts) with your normal soap.             6.  Wash thoroughly, paying special attention to the area where your    surgery  will be performed.  7.  Thoroughly rinse your body with warm water from the neck down.  8.  DO NOT shower/wash with your normal soap after using and rinsing off the CHG Soap.                9.  Pat yourself dry with a clean towel.            10.  Wear  clean pajamas.            11.  Place clean sheets on your bed the night of your first shower and do not  sleep with pets. Day of Surgery : Do not apply any lotions/deodorants the morning of surgery.  Please wear clean clothes to the hospital/surgery center.  FAILURE TO FOLLOW THESE INSTRUCTIONS MAY RESULT IN THE CANCELLATION OF YOUR SURGERY  PATIENT SIGNATURE_________________________________  NURSE SIGNATURE__________________________________  ________________________________________________________________________

## 2020-07-31 NOTE — Progress Notes (Addendum)
COVID Vaccine Completed: No Date COVID Vaccine completed: COVID vaccine manufacturer: [employer]   PCP - Does not have one Cardiologist -   Chest x-ray - N/A EKG - N/A Stress Test -  ECHO -  Cardiac Cath -  Pacemaker/ICD device last checked:  Sleep Study -  CPAP -   Fasting Blood Sugar -  Checks Blood Sugar _____ times a day  Blood Thinner Instructions: Aspirin Instructions: Last Dose:  Anesthesia review:   Patient denies shortness of breath, fever, cough and chest pain at PAT appointment.  Patient able to climb a flight of stairs and perform ADL's without difficullty.   Patient verbalized understanding of instructions that were given to them at the PAT appointment. Patient was also instructed that they will need to review over the PAT instructions again at home before surgery.

## 2020-07-31 NOTE — Telephone Encounter (Signed)
  Follow up Call-  Call back number 07/30/2020  Post procedure Call Back phone  # [phone]  Permission to leave phone message Yes  Some recent data might be hidden     Patient questions:  Do you have a fever, pain , or abdominal swelling? No. Pain Score  0 *  Have you tolerated food without any problems? Yes.    Have you been able to return to your normal activities? Yes.    Do you have any questions about your discharge instructions: Diet   No. Medications  No. Follow up visit  No.  Do you have questions or concerns about your Care? No.  Actions: * If pain score is 4 or above: No action needed, pain <4.  1. Have you developed a fever since your procedure? no  2.   Have you had an respiratory symptoms (SOB or cough) since your procedure? no  3.   Have you tested positive for COVID 19 since your procedure no  4.   Have you had any family members/close contacts diagnosed with the COVID 19 since your procedure?  no   If yes to any of these questions please route to Joylene John, RN and Joella Prince, RN'

## 2020-08-01 ENCOUNTER — Other Ambulatory Visit: Payer: Self-pay

## 2020-08-01 ENCOUNTER — Encounter (HOSPITAL_COMMUNITY)
Admission: RE | Admit: 2020-08-01 | Discharge: 2020-08-01 | Disposition: A | Discharge status: Home / Self Care | Payer: 59 | Source: Ambulatory Visit | Attending: Urology | Admitting: Urology

## 2020-08-01 ENCOUNTER — Encounter (HOSPITAL_COMMUNITY): Payer: Self-pay

## 2020-08-01 HISTORY — DX: Anemia, unspecified: D64.9

## 2020-08-03 ENCOUNTER — Other Ambulatory Visit (HOSPITAL_COMMUNITY)
Admission: RE | Admit: 2020-08-03 | Discharge: 2020-08-03 | Disposition: A | Discharge status: Home / Self Care | Payer: 59 | Source: Ambulatory Visit | Attending: Urology | Admitting: Urology

## 2020-08-03 DIAGNOSIS — Z01818 Encounter for other preprocedural examination: Secondary | ICD-10-CM | POA: Diagnosis present

## 2020-08-03 DIAGNOSIS — Z20822 Contact with and (suspected) exposure to covid-19: Secondary | ICD-10-CM | POA: Diagnosis not present

## 2020-08-03 LAB — SARS CORONAVIRUS 2 (TAT 6-24 HRS): SARS Coronavirus 2: NEGATIVE

## 2020-08-05 MED ORDER — GENTAMICIN SULFATE 40 MG/ML IJ SOLN
5.0000 mg/kg | Freq: Once | INTRAVENOUS | Status: AC
  Administered 2020-08-06: 280 mg via INTRAVENOUS
  Filled 2020-08-05: qty 7

## 2020-08-06 ENCOUNTER — Other Ambulatory Visit: Payer: Self-pay

## 2020-08-06 ENCOUNTER — Ambulatory Visit (HOSPITAL_COMMUNITY): Payer: 59 | Admitting: Anesthesiology

## 2020-08-06 ENCOUNTER — Telehealth (HOSPITAL_COMMUNITY): Payer: Self-pay | Admitting: *Deleted

## 2020-08-06 ENCOUNTER — Encounter (HOSPITAL_COMMUNITY): Payer: Self-pay | Admitting: Urology

## 2020-08-06 ENCOUNTER — Ambulatory Visit (HOSPITAL_COMMUNITY)
Admission: RE | Admit: 2020-08-06 | Discharge: 2020-08-06 | Disposition: A | Discharge status: Home / Self Care | Payer: 59 | Attending: Urology | Admitting: Urology

## 2020-08-06 ENCOUNTER — Ambulatory Visit (HOSPITAL_COMMUNITY): Payer: 59

## 2020-08-06 ENCOUNTER — Encounter (HOSPITAL_COMMUNITY)
Admission: RE | Disposition: A | Discharge status: Home / Self Care | Payer: Self-pay | Source: Home / Self Care | Attending: Urology

## 2020-08-06 DIAGNOSIS — N132 Hydronephrosis with renal and ureteral calculous obstruction: Secondary | ICD-10-CM | POA: Insufficient documentation

## 2020-08-06 DIAGNOSIS — Z880 Allergy status to penicillin: Secondary | ICD-10-CM | POA: Diagnosis not present

## 2020-08-06 DIAGNOSIS — Z881 Allergy status to other antibiotic agents status: Secondary | ICD-10-CM | POA: Insufficient documentation

## 2020-08-06 DIAGNOSIS — F17200 Nicotine dependence, unspecified, uncomplicated: Secondary | ICD-10-CM | POA: Insufficient documentation

## 2020-08-06 HISTORY — PX: CYSTOSCOPY/URETEROSCOPY/HOLMIUM LASER/STENT PLACEMENT: SHX6546

## 2020-08-06 LAB — CBC
HCT: 41.4 % (ref 36.0–46.0)
Hemoglobin: 13.3 g/dL (ref 12.0–15.0)
MCH: 32.7 pg (ref 26.0–34.0)
MCHC: 32.1 g/dL (ref 30.0–36.0)
MCV: 101.7 fL — ABNORMAL HIGH (ref 80.0–100.0)
Platelets: 279 10*3/uL (ref 150–400)
RBC: 4.07 MIL/uL (ref 3.87–5.11)
RDW: 12.7 % (ref 11.5–15.5)
WBC: 6.8 10*3/uL (ref 4.0–10.5)
nRBC: 0 % (ref 0.0–0.2)

## 2020-08-06 SURGERY — CYSTOSCOPY/URETEROSCOPY/HOLMIUM LASER/STENT PLACEMENT
Anesthesia: General | Site: Ureter | Laterality: Left

## 2020-08-06 MED ORDER — MIDAZOLAM HCL 5 MG/5ML IJ SOLN
INTRAMUSCULAR | Status: DC | PRN
  Administered 2020-08-06: 2 mg via INTRAVENOUS

## 2020-08-06 MED ORDER — ORAL CARE MOUTH RINSE: 15.0000 mL | Freq: Once | OROMUCOSAL | Status: AC

## 2020-08-06 MED ORDER — SODIUM CHLORIDE 0.9 % IR SOLN
Status: DC | PRN
  Administered 2020-08-06: 3000 mL

## 2020-08-06 MED ORDER — CIPROFLOXACIN HCL 500 MG PO TABS: 500.0000 mg | ORAL_TABLET | Freq: Two times a day (BID) | ORAL | 0 refills | Status: AC

## 2020-08-06 MED ORDER — LACTATED RINGERS IV SOLN: INTRAVENOUS | Status: DC | PRN

## 2020-08-06 MED ORDER — PHENYLEPHRINE 40 MCG/ML (10ML) SYRINGE FOR IV PUSH (FOR BLOOD PRESSURE SUPPORT)
PREFILLED_SYRINGE | INTRAVENOUS | Status: DC | PRN
  Administered 2020-08-06 (×2): 120 ug via INTRAVENOUS

## 2020-08-06 MED ORDER — DEXAMETHASONE SODIUM PHOSPHATE 10 MG/ML IJ SOLN
INTRAMUSCULAR | Status: DC | PRN
  Administered 2020-08-06: 5 mg via INTRAVENOUS

## 2020-08-06 MED ORDER — ONDANSETRON HCL 4 MG/2ML IJ SOLN
INTRAMUSCULAR | Status: AC
  Filled 2020-08-06: qty 2

## 2020-08-06 MED ORDER — ROCURONIUM BROMIDE 100 MG/10ML IV SOLN
INTRAVENOUS | Status: DC | PRN
  Administered 2020-08-06: 30 mg via INTRAVENOUS

## 2020-08-06 MED ORDER — SUGAMMADEX SODIUM 200 MG/2ML IV SOLN
INTRAVENOUS | Status: DC | PRN
  Administered 2020-08-06: 175 mg via INTRAVENOUS

## 2020-08-06 MED ORDER — CHLORHEXIDINE GLUCONATE 0.12 % MT SOLN
15.0000 mL | Freq: Once | OROMUCOSAL | Status: AC
  Administered 2020-08-06: 15 mL via OROMUCOSAL

## 2020-08-06 MED ORDER — IOHEXOL 300 MG/ML  SOLN
INTRAMUSCULAR | Status: DC | PRN
  Administered 2020-08-06: 50 mL

## 2020-08-06 MED ORDER — MEPERIDINE HCL 50 MG/ML IJ SOLN: 6.2500 mg | INTRAMUSCULAR | Status: DC | PRN

## 2020-08-06 MED ORDER — LACTATED RINGERS IV SOLN: INTRAVENOUS | Status: DC

## 2020-08-06 MED ORDER — LIDOCAINE HCL (CARDIAC) PF 100 MG/5ML IV SOSY
PREFILLED_SYRINGE | INTRAVENOUS | Status: DC | PRN
  Administered 2020-08-06: 60 mg via INTRAVENOUS

## 2020-08-06 MED ORDER — DOCUSATE SODIUM 100 MG PO CAPS: 100.0000 mg | ORAL_CAPSULE | Freq: Every day | ORAL | 0 refills | Status: AC | PRN

## 2020-08-06 MED ORDER — ONDANSETRON HCL 4 MG/2ML IJ SOLN
4.0000 mg | Freq: Once | INTRAMUSCULAR | Status: AC | PRN
  Administered 2020-08-06: 4 mg via INTRAVENOUS

## 2020-08-06 MED ORDER — OXYCODONE-ACETAMINOPHEN 5-325 MG PO TABS
1.0000 | ORAL_TABLET | ORAL | 0 refills | Status: AC | PRN
Start: 2020-08-06 — End: ?

## 2020-08-06 MED ORDER — LIDOCAINE HCL (PF) 2 % IJ SOLN
INTRAMUSCULAR | Status: AC
  Filled 2020-08-06: qty 5

## 2020-08-06 MED ORDER — PROPOFOL 10 MG/ML IV BOLUS
INTRAVENOUS | Status: AC
  Filled 2020-08-06: qty 20

## 2020-08-06 MED ORDER — FENTANYL CITRATE (PF) 100 MCG/2ML IJ SOLN
INTRAMUSCULAR | Status: AC
  Filled 2020-08-06: qty 2

## 2020-08-06 MED ORDER — MIDAZOLAM HCL 2 MG/2ML IJ SOLN
INTRAMUSCULAR | Status: AC
  Filled 2020-08-06: qty 2

## 2020-08-06 MED ORDER — PROPOFOL 10 MG/ML IV BOLUS
INTRAVENOUS | Status: DC | PRN
  Administered 2020-08-06: 200 mg via INTRAVENOUS

## 2020-08-06 MED ORDER — HYDROMORPHONE HCL 1 MG/ML IJ SOLN: 0.2500 mg | INTRAMUSCULAR | Status: DC | PRN

## 2020-08-06 MED ORDER — SODIUM CHLORIDE 0.9 % IR SOLN
Status: DC | PRN
  Administered 2020-08-06: 1000 mL

## 2020-08-06 MED ORDER — DEXAMETHASONE SODIUM PHOSPHATE 10 MG/ML IJ SOLN
INTRAMUSCULAR | Status: AC
  Filled 2020-08-06: qty 1

## 2020-08-06 MED ORDER — FENTANYL CITRATE (PF) 100 MCG/2ML IJ SOLN
INTRAMUSCULAR | Status: DC | PRN
  Administered 2020-08-06: 100 ug via INTRAVENOUS

## 2020-08-06 MED ORDER — ONDANSETRON HCL 4 MG/2ML IJ SOLN
INTRAMUSCULAR | Status: DC | PRN
  Administered 2020-08-06: 4 mg via INTRAVENOUS

## 2020-08-06 SURGICAL SUPPLY — 24 items
BAG URO CATCHER STRL LF (MISCELLANEOUS) ×3 IMPLANT
BASKET ZERO TIP NITINOL 2.4FR (BASKET) ×2 IMPLANT
BSKT STON RTRVL ZERO TP 2.4FR (BASKET) ×1
CATH URET 5FR 28IN OPEN ENDED (CATHETERS) ×3 IMPLANT
CLOTH BEACON ORANGE TIMEOUT ST (SAFETY) ×3 IMPLANT
FIBER LASER MOSES 200 DFL (Laser) IMPLANT
GLOVE BIOGEL M 7.0 STRL (GLOVE) ×3 IMPLANT
GOWN STRL REUS W/TWL LRG LVL3 (GOWN DISPOSABLE) ×3 IMPLANT
GUIDEWIRE STR DUAL SENSOR (WIRE) ×4 IMPLANT
GUIDEWIRE ZIPWRE .038 STRAIGHT (WIRE) IMPLANT
IV NS 1000ML (IV SOLUTION) ×3
IV NS 1000ML BAXH (IV SOLUTION) ×1 IMPLANT
KIT TURNOVER KIT A (KITS) IMPLANT
LASER FIB FLEXIVA PULSE ID 365 (Laser) IMPLANT
MANIFOLD NEPTUNE II (INSTRUMENTS) ×3 IMPLANT
PACK CYSTO (CUSTOM PROCEDURE TRAY) ×3 IMPLANT
SHEATH URETERAL 12FRX35CM (MISCELLANEOUS) IMPLANT
STENT URET 6FRX26 CONTOUR (STENTS) ×2 IMPLANT
TRACTIP FLEXIVA PULS ID 200XHI (Laser) IMPLANT
TRACTIP FLEXIVA PULSE ID 200 (Laser) ×3
TUBE FEEDING 8FR 16IN STR KANG (MISCELLANEOUS) ×2 IMPLANT
TUBING CONNECTING 10 (TUBING) ×2 IMPLANT
TUBING CONNECTING 10' (TUBING) ×1
TUBING UROLOGY SET (TUBING) ×3 IMPLANT

## 2020-08-06 NOTE — H&P (Signed)
Office Visit Report     07/20/2020   --------------------------------------------------------------------------------   Sydney [name]  MRN: [id number]  DOB: Feb 03, 1971, 49 year old Female  SSN:    PRIMARY CARE:    REFERRING:  Robert P. Vanita Panda, MD  PROVIDER:  Rexene Alberts, M.D.  LOCATION:  [facility] [phone]     --------------------------------------------------------------------------------   CC/HPI: Sydney [name] is a 49 year old female seen in consultation today for left flank pain.   She presented to [facility] emergency department today for 2-week history of left flank pain. Is been associated with nausea and some emesis. She denies fevers or chills. She has CT A/P on 07/20/2020 which revealed left nephrolithiasis and hydroureteronephrosis with a distal left ureteral calculus measuring 1.3 cm in craniocaudal dimension. She did have a slightly delayed left nephrogram. She was discharged home with pain control and represents her office today with persistent left flank pain. She denies fevers or chills. She does have a history of urolithiasis and has passed several stones in the past. She has never required surgical intervention for her stones. She denies dysuria or gross hematuria.       ALLERGIES: metronidazole penicillin    MEDICATIONS: Flomax 0.4 mg capsule  Hydrocodone-Acetaminophen 5 mg-325 mg tablet  Prozac 40 mg capsule  Zofran 4 mg tablet     GU PSH: None   NON-GU PSH: None   GU PMH: None   NON-GU PMH: Anxiety Crohn''s disease of large intestine with unspecified complications GERD Other dysphagia Plantar wart Tobacco abuse counseling    FAMILY HISTORY: None   SOCIAL HISTORY: Marital Status: Married Current Smoking Status: Patient smokes.   Tobacco Use Assessment Completed: Used Tobacco in last 30 days?    REVIEW OF SYSTEMS:    GU Review Female:   Patient denies frequent urination, hard to postpone urination, burning /pain with  urination, get up at night to urinate, leakage of urine, stream starts and stops, trouble starting your stream, have to strain to urinate, and being pregnant.  Gastrointestinal (Upper):   Patient denies nausea, vomiting, and indigestion/ heartburn.  Gastrointestinal (Lower):   Patient denies diarrhea and constipation.  Constitutional:   Patient denies fever, night sweats, weight loss, and fatigue.  Skin:   Patient denies skin rash/ lesion and itching.  Eyes:   Patient denies blurred vision and double vision.  Ears/ Nose/ Throat:   Patient denies sore throat and sinus problems.  Hematologic/Lymphatic:   Patient denies easy bruising and swollen glands.  Cardiovascular:   Patient denies leg swelling and chest pains.  Respiratory:   Patient denies cough and shortness of breath.  Endocrine:   Patient denies excessive thirst.  Musculoskeletal:   Patient denies back pain and joint pain.  Neurological:   Patient denies headaches and dizziness.  Psychologic:   Patient denies depression and anxiety.   VITAL SIGNS:      07/20/2020 01:51 PM  Weight 129 lb / 58.51 kg  Height 63 in / 160.02 cm  Pulse 56 /min  Temperature 96.9 F / 36.0 C  BMI 22.8 kg/m   MULTI-SYSTEM PHYSICAL EXAMINATION:    Constitutional: Well-nourished. No physical deformities. Normally developed. Good grooming.  Respiratory: No labored breathing, no use of accessory muscles.   Cardiovascular: Normal temperature, normal extremity pulses, no swelling, no varicosities.  Gastrointestinal: No mass, no tenderness, no rigidity, non obese abdomen. No CVA tenderness     Complexity of Data:  Source Of History:  Patient, Medical Record Summary  Lab Test Review:  BMP, CBC with Diff  Urine Test Review:   Urinalysis  X-Ray Review: C.T. Abdomen/Pelvis: Reviewed Films. Reviewed Report.     PROCEDURES:          Urinalysis w/Scope Dipstick Dipstick Cont'd Micro  Color: Yellow Bilirubin: Neg mg/dL WBC/hpf: 10 - 20/hpf  Appearance:  Cloudy Ketones: Neg mg/dL RBC/hpf: 10 - 20/hpf  Specific Gravity: 1.015 Blood: 3+ ery/uL Bacteria: Few (10-25/hpf)  pH: 6.5 Protein: 1+ mg/dL Cystals: NS (Not Seen)  Glucose: Neg mg/dL Urobilinogen: 0.2 mg/dL Casts: NS (Not Seen)    Nitrites: Neg Trichomonas: Not Present    Leukocyte Esterase: 2+ leu/uL Mucous: Not Present      Epithelial Cells: 6 - 10/hpf      Yeast: NS (Not Seen)      Sperm: Not Present    ASSESSMENT:      ICD-10 Details  1 GU:   Flank Pain - R10.84   2   Ureteral calculus - N20.1    PLAN:           Orders Labs CULTURE, URINE          Document Letter(s):  Created for Patient: Clinical Summary         Notes:   1. Left ureteral stone: CT A/P 07/20/2020 with 1.3 cm distal left ureteral stone associated with left hydroureteronephrosis. Scheduled for cystoscopy, left retrograde pyelogram, left ureteral stent placement today. Obtain urine culture today. We will schedule for cystoscopy, left retrograde pyelogram, left ureteroscopy with laser lithotripsy and stent exchange in the coming weeks for definitive treatment of her stone.   2. Left renal stones: Seen on CT A/P 07/20/2020. We will plan to treat concurrently with her left ureteral stone in a few weeks.   We discussed the options for management of ureteral stones, including trial of passage with medical expulsive therapy, ESWL, and ureteroscopy with laser lithotripsy. Smaller and more distal stones are best managed with trial of passage with medical expulsive therapy. Larger and more proximal stones are less likely to pass spontaneously, and are therefore best managed with either ESWL or ureteroscopy, with ureteroscopy offering higher stone free rates. The risks and benefits of each option were discussed.   Trial of passage: oral analgesics and medical expulsive therapy with an alpha blocker will be provided to assist with stone passage. The patient is instructed to return or go to ER for emergent intervention if  intolerable pain, intractable nausea/vomiting, or fever develops.   ESWL: risks and benefits of ESWL were outlined including infection, bleeding, pain, steinstrasse, kidney injury, need for ancillary treatments, and global anesthesia risks including but not limited to CVA, MI, DVT, PE, pneumonia, and death.   Ureteroscopy: risks and benefits of ureteroscopy were outlined, including infection, bleeding, pain, temporary ureteral stent and associated stent bother, ureteral injury, ureteral stricture, need for ancillary treatments, and global anesthesia risks including but not limited to CVA, MI, DVT, PE, pneumonia, and death.   CC: Carmin Muskrat, MD.        Next Appointment:      Next Appointment: 07/20/2020 01:30 PM    Appointment Type: 93 Minute New Patient    Location: [facility] [phone]    Provider: Rexene Alberts, M.D.    Reason for Visit: NP- ER f/u for large Kidney stone      Signed by Rexene Alberts, M.D. on 07/20/20 at 3:36 PM (EST)  Urology Preoperative H&P   Chief Complaint: Left ureteral stone  History of Present Illness: Sydney Gilchrest  [name] is a 49 y.o. female with a left ureteral stone here for cysto, L URS/LL, left stent exchange. Denies fevers or chills.  Past Medical History:  Diagnosis Date  . ADD (attention deficit disorder)   . Allergy   . Anemia   . Crohn's colitis ([place])   . Depression with anxiety   . GERD (gastroesophageal reflux disease)   . History of iron deficiency anemia   . Plantar wart of left foot 04/22/2018    Past Surgical History:  Procedure Laterality Date  . ABDOMINAL HYSTERECTOMY  2011  . CESAREAN SECTION     x 3  . CHOLECYSTECTOMY    . COLONOSCOPY W/ BIOPSIES AND POLYPECTOMY  12/24/2009   pseudopolyps (hyperplastic), crohn's   . CYSTOSCOPY W/ URETERAL STENT PLACEMENT Left 07/20/2020   Procedure: CYSTOSCOPY WITH RETROGRADE PYELOGRAM/URETERAL STENT PLACEMENT;  Surgeon: Janith Lima, MD;  Location: [facility];  Service: Urology;   Laterality: Left;  . TONSILLECTOMY  1974  . TONSILLECTOMY    . UPPER GASTROINTESTINAL ENDOSCOPY  12/24/2009   w/bx, gastritis    Allergies:  Allergies  Allergen Reactions  . Metronidazole Nausea And Vomiting and Other (See Comments)    "Made me deathly sick"  . Penicillins Other (See Comments)    from childhood- reaction not recalled    Family History  Problem Relation Age of Onset  . Crohn's disease Mother   . Crohn's disease Other        grandmother  . Colon cancer Other        great grandmother  . Crohn's disease Maternal Grandmother   . Breast cancer Paternal Grandmother   . Ovarian cancer Neg Hx   . Rectal cancer Neg Hx   . Stomach cancer Neg Hx   . Esophageal cancer Neg Hx     Social History:  reports that she has been smoking cigars. She has never used smokeless tobacco. She reports current alcohol use. She reports current drug use. Frequency: 6.00 times per week. Drug: Marijuana.  ROS: A complete review of systems was performed.  All systems are negative except for pertinent findings as noted.  Physical Exam:  Vital signs in last 24 hours: Temp:  [98.5 F (36.9 C)] 98.5 F (36.9 C) (11/29 1311) Pulse Rate:  [57] 57 (11/29 1311) Resp:  [18] 18 (11/29 1311) BP: (125)/(81) 125/81 (11/29 1311) SpO2:  [100 %] 100 % (11/29 1311) Weight:  [56.7 kg] 56.7 kg (11/29 1311) Constitutional:  Alert and oriented, No acute distress Cardiovascular: Regular rate and rhythm Respiratory: Normal respiratory effort, Lungs clear bilaterally GI: Abdomen is soft, nontender, nondistended, no abdominal masses GU: No CVA tenderness Lymphatic: No lymphadenopathy Neurologic: Grossly intact, no focal deficits Psychiatric: Normal mood and affect  Laboratory Data:  No results for input(s): WBC, HGB, HCT, PLT in the last 72 hours.  No results for input(s): NA, K, CL, GLUCOSE, BUN, CALCIUM, CREATININE in the last 72 hours.  Invalid input(s): CO3   No results found for this or any  previous visit (from the past 24 hour(s)). Recent Results (from the past 240 hour(s))  SARS CORONAVIRUS 2 (TAT 6-24 HRS) Nasopharyngeal Nasopharyngeal Swab     Status: None   Collection Time: 08/03/20  1:20 PM   Specimen: Nasopharyngeal Swab  Result Value Ref Range Status   SARS Coronavirus 2 NEGATIVE NEGATIVE Final    Comment: (NOTE) SARS-CoV-2 target nucleic acids are NOT DETECTED.  The SARS-CoV-2 RNA is generally detectable in upper and lower respiratory specimens during the acute  phase of infection. Negative results do not preclude SARS-CoV-2 infection, do not rule out co-infections with other pathogens, and should not be used as the sole basis for treatment or other patient management decisions. Negative results must be combined with clinical observations, patient history, and epidemiological information. The expected result is Negative.  Fact Sheet for Patients: SugarRoll.be  Fact Sheet for Healthcare Providers: https://www.woods-mathews.com/  This test is not yet approved or cleared by the [place] FDA and  has been authorized for detection and/or diagnosis of SARS-CoV-2 by FDA under an Emergency Use Authorization (EUA). This EUA will remain  in effect (meaning this test can be used) for the duration of the COVID-19 declaration under Se ction 564(b)(1) of the Act, 21 U.S.C. section 360bbb-3(b)(1), unless the authorization is terminated or revoked sooner.  Performed at [facility], [place] [address] [place], [place] [address]     Renal Function: No results for input(s): CREATININE in the last 168 hours. Estimated Creatinine Clearance: 50.5 mL/min (A) (by C-G formula based on SCr of 1.09 mg/dL (H)).  Radiologic Imaging: No results found.  I independently reviewed the above imaging studies.  Assessment and Plan VENDELA TROUNG is a 49 y.o. female with left ureteral stone here for cysto, left ureteroscopy, LL, basket  extraction of stone, L stent exchange. UCx in office 11/12 neg. Received bactrim for one week.  Receiving gent in preop.   Matt R. Annaliz Aven MD 08/06/2020, 3:43 PM  [facility] Pager: [phone]): [phone]

## 2020-08-06 NOTE — Anesthesia Postprocedure Evaluation (Signed)
Anesthesia Post Note  Patient: Sydney [name]  Procedure(s) Performed: CYSTOSCOPY/RETROGRADE/URETEROSCOPY/HOLMIUM LASER/STENT PLACEMENT (Left Ureter)     Patient location during evaluation: PACU Anesthesia Type: General Level of consciousness: awake Pain management: pain level controlled Vital Signs Assessment: post-procedure vital signs reviewed and stable Respiratory status: spontaneous breathing, nonlabored ventilation, respiratory function stable and patient connected to nasal cannula oxygen Cardiovascular status: blood pressure returned to baseline and stable Postop Assessment: no apparent nausea or vomiting Anesthetic complications: no   No complications documented.  Last Vitals:  Vitals:   08/06/20 1810 08/06/20 1815  BP: 109/78 110/80  Pulse: 65 66  Resp: 11 16  Temp: 36.7 C 36.9 C  SpO2: 97% 97%    Last Pain:  Vitals:   08/06/20 1815  TempSrc:   PainSc: 0-No pain                 Amarylis Rovito P Pawel Soules

## 2020-08-06 NOTE — Anesthesia Preprocedure Evaluation (Signed)
Anesthesia Evaluation  Patient identified by MRN, date of birth, ID band Patient awake    Reviewed: Allergy & Precautions, NPO status , Patient's Chart, lab work & pertinent test results  Airway Mallampati: I  TM Distance: >3 FB Neck ROM: Full    Dental   Pulmonary Current Smoker and Patient abstained from smoking.,    Pulmonary exam normal        Cardiovascular Normal cardiovascular exam     Neuro/Psych Anxiety Depression    GI/Hepatic GERD  Medicated and Controlled,H/O Crohns   Endo/Other    Renal/GU      Musculoskeletal   Abdominal   Peds  Hematology   Anesthesia Other Findings   Reproductive/Obstetrics                             Anesthesia Physical Anesthesia Plan  ASA: II  Anesthesia Plan: General   Post-op Pain Management:    Induction: Intravenous  PONV Risk Score and Plan: 2  Airway Management Planned: LMA  Additional Equipment:   Intra-op Plan:   Post-operative Plan: Extubation in OR  Informed Consent: I have reviewed the patients History and Physical, chart, labs and discussed the procedure including the risks, benefits and alternatives for the proposed anesthesia with the patient or authorized representative who has indicated his/her understanding and acceptance.       Plan Discussed with: CRNA and Surgeon  Anesthesia Plan Comments:         Anesthesia Quick Evaluation

## 2020-08-06 NOTE — Op Note (Signed)
Operative Note  Preoperative diagnosis:  1.  Left ureteral stone  Postoperative diagnosis: 1.  Left ureteral stone  Procedure(s): 1.  Cystoscopy 2.  Left ureteroscopy with laser lithotripsy and basket extraction of stone 3. Left retrograde pyelogram 4.  Left ureteral stent exchange 5.  Fluoroscopy less than 1 hour with intraoperative interpretation  Surgeon: Jettie Pagan, MD  Assistants:  None  Anesthesia:  General  Complications:  None  EBL:  minimal  Specimens: 1. Left ureteral stone  Drains/Catheters: 1.  Left 6 French by 26 cm ureteral stent  Intraoperative findings:   1. Impacted distal left ureteral stone measuring 73mm x 1.3cm. Successful removal of all fragments.  The stone was impacted with some stone trauma around the site, elected to leave a ureteral stent without a tether string for removal in the office in 7-10 days.  Indication:  Sydney [name] is a 49 y.o. female with a distal left ureteral obstructing stone measuring 1.3 cm in craniocaudal direction.  This was seen on CT A/P.  She was seen in the office and due to persistent left-sided flank pain she proceeded to undergo a left ureteral stent on 07/20/2020.  Urine culture 07/20/2020 resulted no growth in the office.  Left renal pelvis urine culture on 11/12 resulted fifty K.  She underwent treatment with Bactrim for approximately 1 week.  She presents today with preoperative gentamicin 5 mg/kilograms.  After thorough discussion including all relevant respite events and alternatives, she presents to the operating for definitive treatment of left ureteral stone.  Description of procedure: After informed consent was obtained from the patient, the patient was identified and taken to the operating room and placed in the supine position.  General anesthesia was administered as well as perioperative IV antibiotics.  At the beginning of the case, a time-out was performed to properly identify the patient, the surgery to be  performed, and the surgical site.  Sequential compression devices were applied to the lower extremities at the beginning of the case for DVT prophylaxis.  The patient was then placed in the dorsal lithotomy supine position, prepped and draped in sterile fashion.  Preliminary scout fluoroscopy revealed that there was a 5 mm x 1.3 cm calcification area at the distal left ureter, which corresponds to the stone found on the preoperative CT scan. We then passed the 21-French rigid cystoscope through the urethra and into the bladder under vision without any difficulty , noting a normal urethra without strictures and.  A systematic evaluation of the bladder revealed no evidence of any suspicious bladder lesions.  Ureteral orifices were in normal position.    The distal aspect of the ureteral stent was seen protruding from the left ureteral orifice.  We then used the alligator-tooth forceps and grasped the distal end of the ureteral stent and brought it out the urethral meatus while watching the proximal coil straighten out nicely on fluoroscopy. Through the ureteral stent, we then passed a 0.038 sensor wire up to the level of the renal pelvis.  The ureteral stent was then removed, leaving the sensor wire up the left ureter.    Under cystoscopic and flouroscopic guidance, we cannulated the left ureteral orifice with a 5-French open-ended ureteral catheter and a gentle retrograde pyelogram was performed, revealing a normal caliber ureter without any filling defects. There was moderate hydronephrosis of the collecting system. There was a 5 mm x 1.3 cm filling defect in the distal left ureter corresponding to the stone. A 0.038 sensor wire was then  passed up to the level of the renal pelvis and secured to the drape as a safety wire. The ureteral catheter and cystoscope were removed, leaving the safety wire in place.   A semi-rigid ureteroscope was passed alongside the wire up the distal ureter where we encountered the  impacted distal left ureteral stone. Using the 200 micron holmium laser fiber, the stone was fragmented completely. A 2.2 Fr zero tip basket was used to remove the fragments under visual guidance. These were sent for chemical analysis.  A left pyelogram was performed to delineate the calyceal system.  Once the ureteroscope was removed, the Glidewire was backloaded through the rigid cystoscope, which was then advanced down the urethra and into the bladder. We then used the Glidewire under direct vision through the rigid cystoscope and under fluoroscopic guidance and passed up a 6-French, 26 cm double-pigtail ureteral stent up ureter, making sure that the proximal and distal ends coiled within the kidney and bladder respectively.  We did not leave a tether string.  The cystoscope was then advanced back into the bladder under vision.  We were able to see the distal stent coiling nicely within the bladder.  The bladder was then emptied with irrigation solution.  The cystoscope was then removed.    The patient tolerated the procedure well and there was no complication. Patient was awoken from anesthesia and taken to the recovery room in stable condition. I was present and scrubbed for the entirety of the case.  Plan:  Patient will be discharged home.  Follow up with me in 7 to 10 days for stent removal in the office.  Matt R. Jamicheal Heard MD [facility]  Pager: [phone]

## 2020-08-06 NOTE — Transfer of Care (Signed)
Immediate Anesthesia Transfer of Care Note  Patient: Sydney [name]  Procedure(s) Performed: CYSTOSCOPY/RETROGRADE/URETEROSCOPY/HOLMIUM LASER/STENT PLACEMENT (Left Ureter)  Patient Location: PACU  Anesthesia Type:General  Level of Consciousness: awake and alert   Airway & Oxygen Therapy: Patient Spontanous Breathing and Patient connected to face mask oxygen  Post-op Assessment: Report given to RN and Post -op Vital signs reviewed and stable  Post vital signs: Reviewed and stable  Last Vitals:  Vitals Value Taken Time  BP    Temp    Pulse 77 08/06/20 1736  Resp 17 08/06/20 1736  SpO2 100 % 08/06/20 1736  Vitals shown include unvalidated device data.  Last Pain:  Vitals:   08/06/20 1311  TempSrc: Oral  PainSc: 0-No pain         Complications: No complications documented.

## 2020-08-06 NOTE — Discharge Instructions (Signed)
   Activity:  You are encouraged to ambulate frequently (about every hour during waking hours) to help prevent blood clots from forming in your legs or lungs.  However, you should not engage in any heavy lifting (> 10-15 lbs), strenuous activity, or straining.   Diet: You should advance your diet as instructed by your physician.  It will be normal to have some bloating, nausea, and abdominal discomfort intermittently.   Prescriptions:  You will be provided a prescription for pain medication to take as needed.  If your pain is not severe enough to require the prescription pain medication, you may take extra strength Tylenol instead which will have less side effects.  You should also take a prescribed stool softener to avoid straining with bowel movements as the prescription pain medication may constipate you.   What to call us about: You should call the office [phone]) if you develop fever > 101 or develop persistent vomiting. Activity:  You are encouraged to ambulate frequently (about every hour during waking hours) to help prevent blood clots from forming in your legs or lungs.  However, you should not engage in any heavy lifting (> 10-15 lbs), strenuous activity, or straining.

## 2020-08-06 NOTE — Anesthesia Procedure Notes (Signed)
Date/Time: 08/06/2020 5:25 PM Performed by: Cynda Familia, CRNA Oxygen Delivery Method: Simple face mask Placement Confirmation: positive ETCO2 and breath sounds checked- equal and bilateral Dental Injury: Teeth and Oropharynx as per pre-operative assessment

## 2020-08-06 NOTE — Anesthesia Procedure Notes (Signed)
Procedure Name: Intubation Performed by: Rosaland Lao, CRNA Pre-anesthesia Checklist: Patient identified, Emergency Drugs available, Suction available and Patient being monitored Patient Re-evaluated:Patient Re-evaluated prior to induction Oxygen Delivery Method: Circle system utilized Preoxygenation: Pre-oxygenation with 100% oxygen Induction Type: IV induction Ventilation: Mask ventilation without difficulty Laryngoscope Size: Miller and 2 Grade View: Grade I Tube type: Oral Tube size: 7.0 mm Number of attempts: 1 Airway Equipment and Method: Stylet and Oral airway Placement Confirmation: ETT inserted through vocal cords under direct vision,  positive ETCO2 and breath sounds checked- equal and bilateral Secured at: 22 cm Tube secured with: Tape Dental Injury: Teeth and Oropharynx as per pre-operative assessment

## 2020-08-07 ENCOUNTER — Encounter (HOSPITAL_COMMUNITY): Payer: Self-pay | Admitting: Urology

## 2020-08-11 ENCOUNTER — Encounter: Payer: Self-pay | Admitting: Internal Medicine

## 2022-06-12 IMAGING — CT CT ABD-PELV W/ CM
2 of 5 series · 15 of 46 positions shown, 17 images · IV contrast (Omni 300)
Comparison: 07/12/2005 abdominal ultrasound. 09/12/2004 CT abdomen
and pelvis.

CLINICAL DATA: Abdominal distension Flank pain, kidney stone
suspected Bowel obstruction suspected Abdominal pain, acute,
nonlocalized

EXAM:
CT ABDOMEN AND PELVIS WITH CONTRAST
TECHNIQUE: Multidetector CT imaging of the abdomen and pelvis was performed
using the standard protocol following bolus administration of
intravenous contrast.
CONTRAST:  100mL OMNIPAQUE IOHEXOL 300 MG/ML  SOLN

[Series 3: a/p w/ 5mm · axial · 0.67mm/px · z∈[+784,+1174]mm · 12 of 88 slices shown, 14 images]
[im 5/88  soft-tissue]
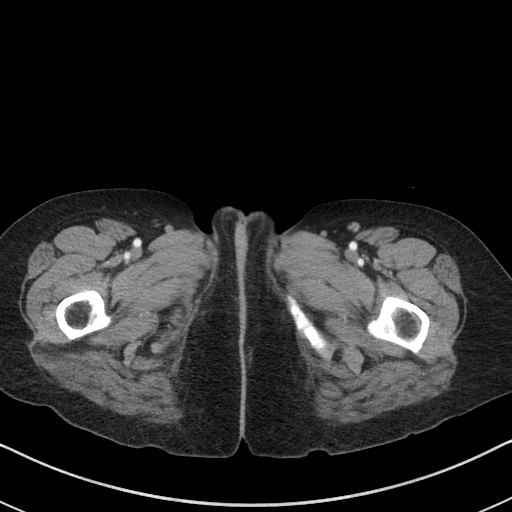
[im 5/88  bone]
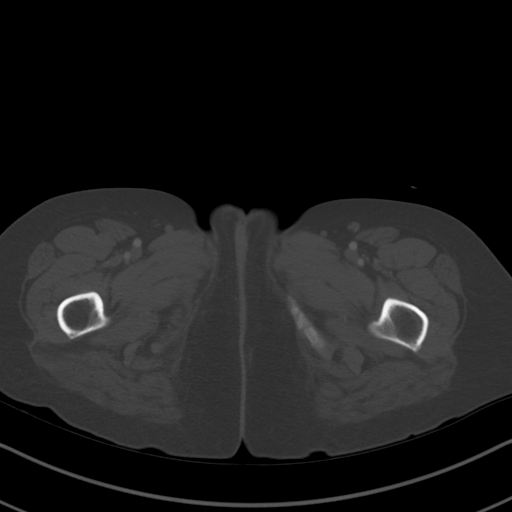
[im 14/88  soft-tissue]
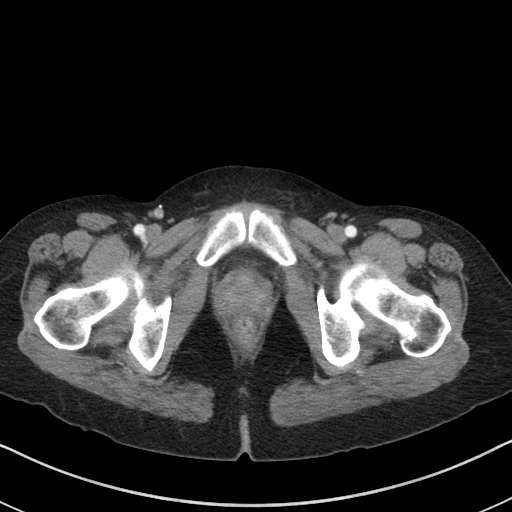
[im 19/88  soft-tissue]
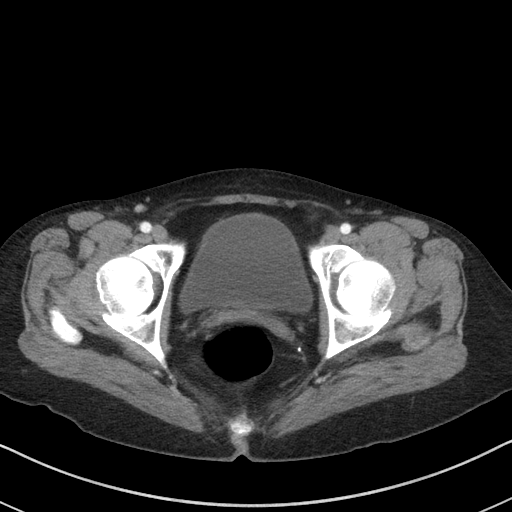
[im 28/88  soft-tissue]
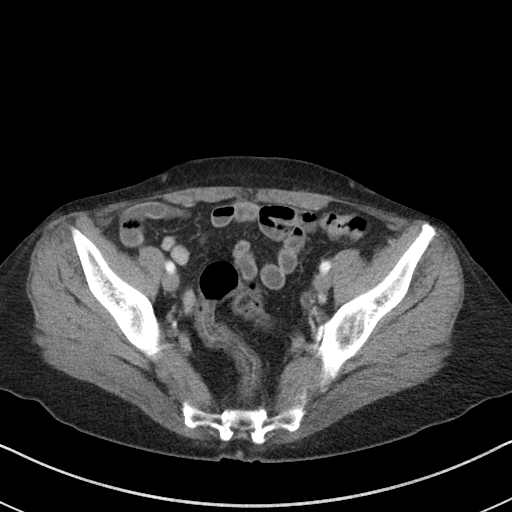
[im 33/88  soft-tissue]
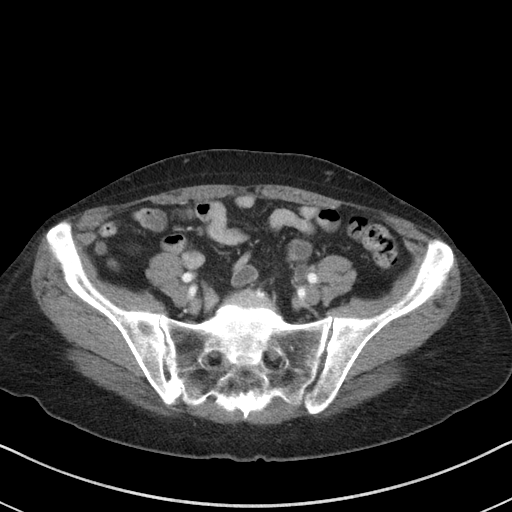
[im 42/88  soft-tissue]
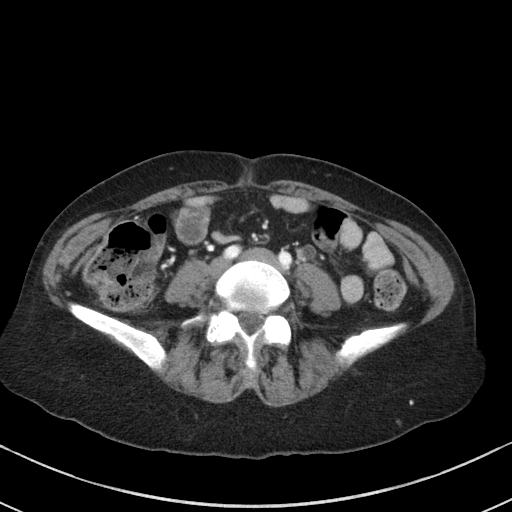
[im 46/88  soft-tissue]
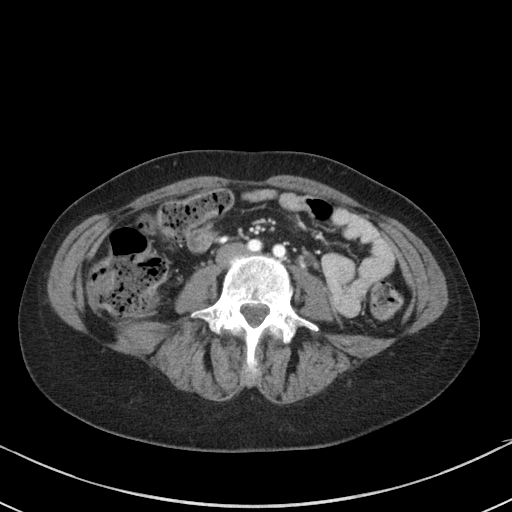
[im 55/88  soft-tissue]
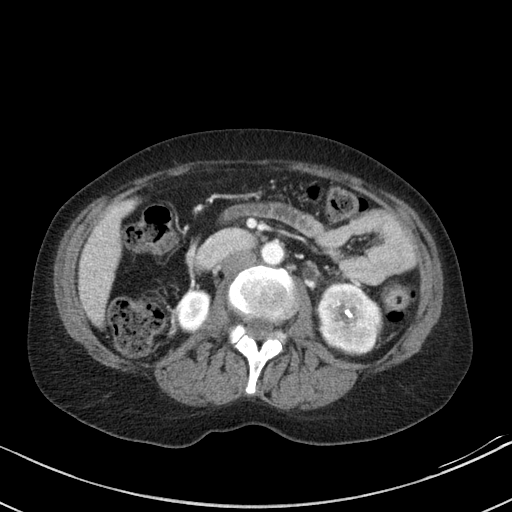
[im 60/88  soft-tissue]
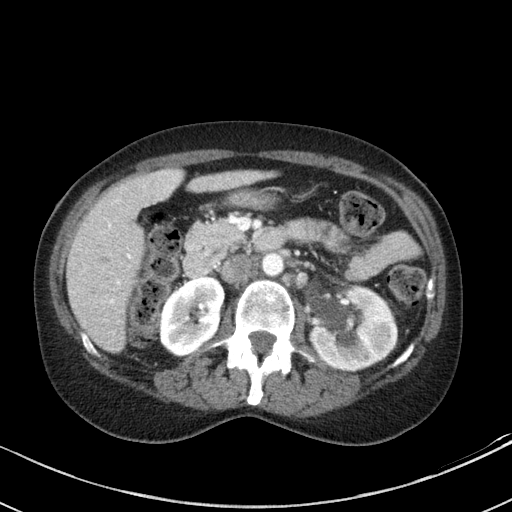
[im 60/88  bone]
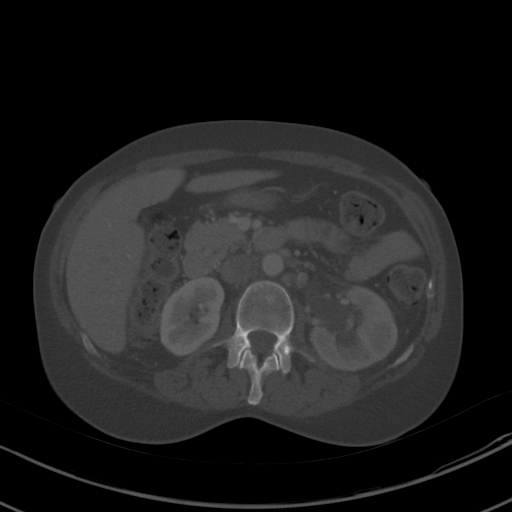
[im 69/88  soft-tissue]
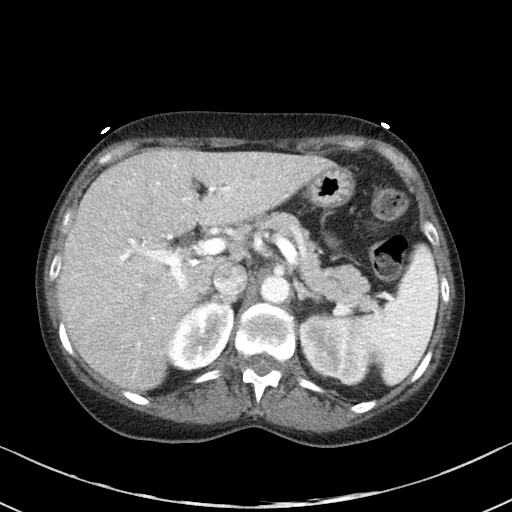
[im 74/88  soft-tissue]
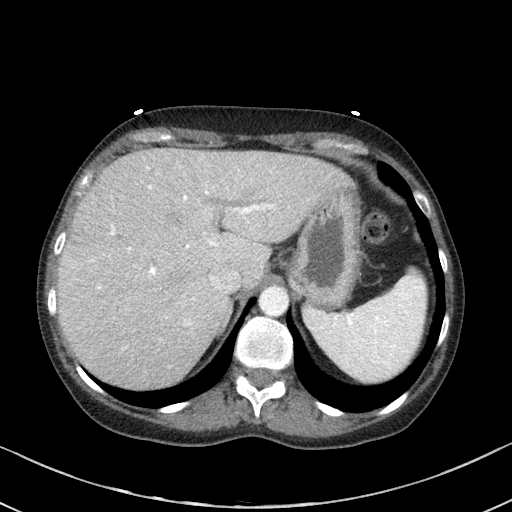
[im 83/88  soft-tissue]
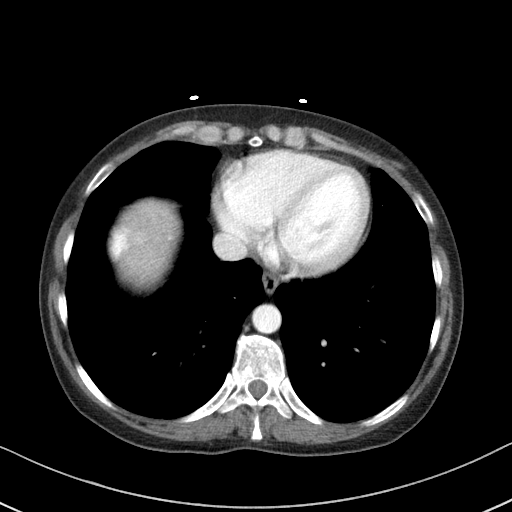

[Series 6: a/p w/ cor · coronal · 0.62mm/px · 3 of 117 slices shown]
[im 39/117  soft-tissue]
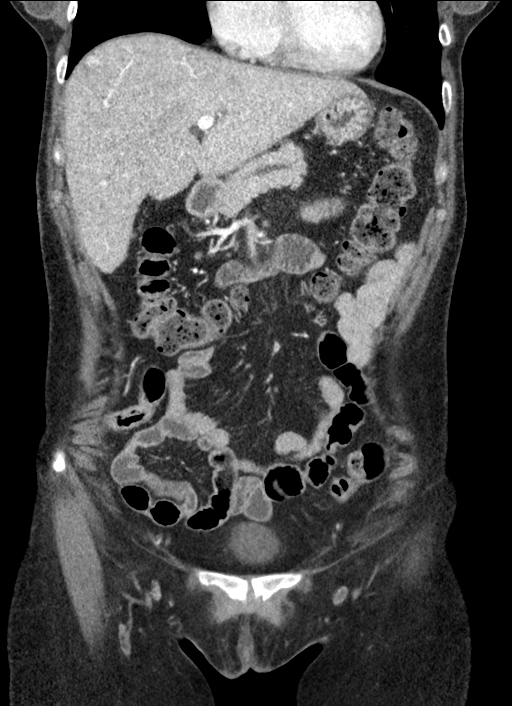
[im 52/117  soft-tissue]
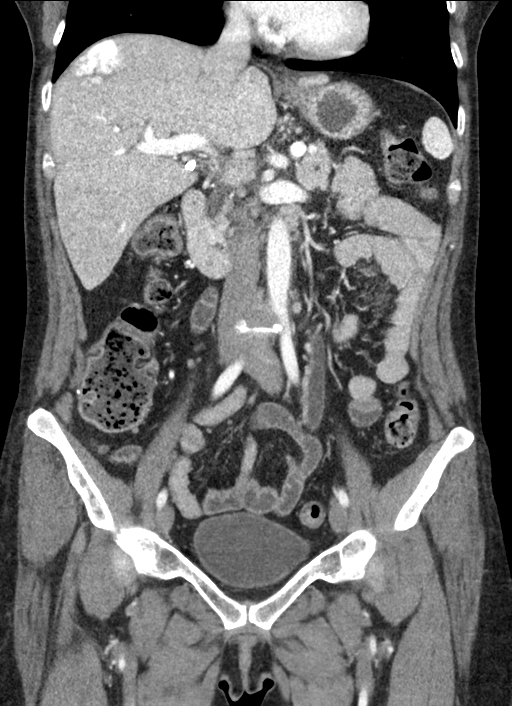
[im 65/117  soft-tissue]
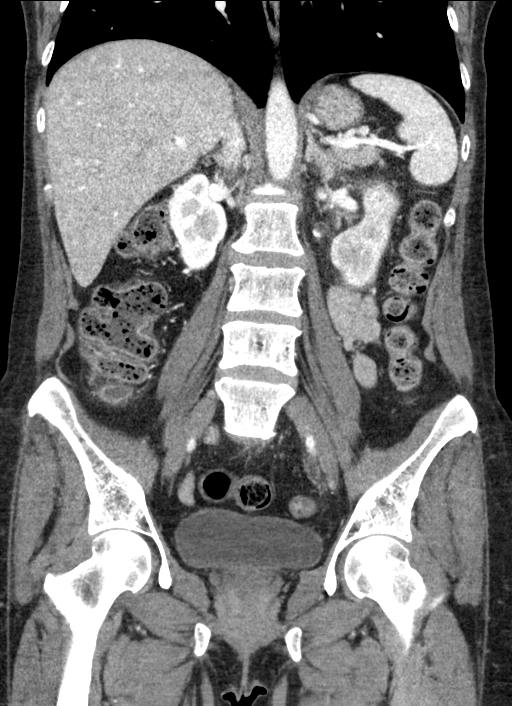

[15 of 46 positions shown; findings below may reference images not displayed]

FINDINGS: Lower chest: Minimal subsegmental atelectasis. Partially imaged
bilateral breast prostheses.

Hepatobiliary: 3.6 x 3.3 cm right hepatic dome hemangioma ([DATE]). No
biliary dilatation. Gallbladder is surgically absent.

Pancreas: No focal lesions. No surrounding inflammation. Prominence
of the pancreatic duct.

Spleen: Unremarkable.

Adrenals/Urinary Tract: Adrenal glands are unremarkable. The left
kidney is asymmetrically edematous with a slightly delayed
nephrogram. 0.5 cm left inferior pole calculus. Mild left
hydroureteronephrosis with a distal left ureteral calculus measuring
0.5 cm in transaxial dimension ([DATE]). In craniocaudal dimension the
calculus measures 1.3 cm ([DATE]). 3 mm right renal calculus ([DATE]).
Bladder is unremarkable.

Stomach/Bowel: Stomach is within normal limits. Appendix appears
normal. No evidence of obstruction. No bowel wall thickening or
inflammatory changes. No ascites.

Vascular/Lymphatic: Vasculature is within normal limits for
patient's age. Prominent left retroperitoneal/perinephric nodes are
reactive.

Reproductive: Sequela of hysterectomy.

Other: None.

Musculoskeletal: No acute or significant osseous findings.
IMPRESSION: Left nephrolithiasis and hydroureteronephrosis. Distal left ureteral
calculus measuring 1.3 cm in craniocaudal dimension.

Slightly delayed left nephrogram.

3.6 cm right hepatic dome hemangioma.

## 2022-08-07 ENCOUNTER — Encounter: Payer: Self-pay | Admitting: Internal Medicine
